# Patient Record
Sex: Male | Born: 1935 | Race: White | Hispanic: No | Marital: Married
Health system: Southern US, Community
[De-identification: ages and names within clinical notes are randomized; demographics above are authoritative.]

## PROBLEM LIST (undated history)

## (undated) ENCOUNTER — Emergency Department: Admission: EM | Payer: No Typology Code available for payment source

## (undated) DIAGNOSIS — K219 Gastro-esophageal reflux disease without esophagitis: Secondary | ICD-10-CM

## (undated) DIAGNOSIS — E785 Hyperlipidemia, unspecified: Secondary | ICD-10-CM

## (undated) DIAGNOSIS — Z974 Presence of external hearing-aid: Secondary | ICD-10-CM

## (undated) HISTORY — PX: TONSILLECTOMY: SUR1361

---

## 2009-09-18 ENCOUNTER — Ambulatory Visit: Payer: Self-pay | Admitting: Internal Medicine

## 2009-09-23 ENCOUNTER — Ambulatory Visit: Payer: Self-pay | Admitting: Sports Medicine

## 2009-10-18 ENCOUNTER — Ambulatory Visit: Payer: Self-pay | Admitting: Anesthesiology

## 2009-10-27 ENCOUNTER — Ambulatory Visit: Payer: Self-pay | Admitting: Anesthesiology

## 2009-11-30 ENCOUNTER — Ambulatory Visit: Payer: Self-pay | Admitting: Anesthesiology

## 2009-12-29 ENCOUNTER — Ambulatory Visit: Payer: Self-pay | Admitting: Anesthesiology

## 2014-03-12 ENCOUNTER — Emergency Department: Payer: Self-pay | Admitting: Emergency Medicine

## 2014-03-12 LAB — CBC
HCT: 39.9 % — ABNORMAL LOW (ref 40.0–52.0)
HGB: 12.9 g/dL — ABNORMAL LOW (ref 13.0–18.0)
MCH: 31 pg (ref 26.0–34.0)
MCHC: 32.5 g/dL (ref 32.0–36.0)
MCV: 96 fL (ref 80–100)
Platelet: 272 10*3/uL (ref 150–440)
RBC: 4.17 10*6/uL — ABNORMAL LOW (ref 4.40–5.90)
RDW: 13.5 % (ref 11.5–14.5)
WBC: 8.4 10*3/uL (ref 3.8–10.6)

## 2014-03-13 LAB — COMPREHENSIVE METABOLIC PANEL
ALK PHOS: 82 U/L
Albumin: 3.4 g/dL (ref 3.4–5.0)
Anion Gap: 9 (ref 7–16)
BILIRUBIN TOTAL: 0.3 mg/dL (ref 0.2–1.0)
BUN: 16 mg/dL (ref 7–18)
CALCIUM: 8.4 mg/dL — AB (ref 8.5–10.1)
Chloride: 104 mmol/L (ref 98–107)
Co2: 27 mmol/L (ref 21–32)
Creatinine: 1.17 mg/dL (ref 0.60–1.30)
EGFR (Non-African Amer.): >60
GLUCOSE: 152 mg/dL — AB (ref 65–99)
Osmolality: 284 (ref 275–301)
Potassium: 3.4 mmol/L — ABNORMAL LOW (ref 3.5–5.1)
SGOT(AST): 79 U/L — ABNORMAL HIGH (ref 15–37)
SGPT (ALT): 64 U/L — ABNORMAL HIGH
Sodium: 140 mmol/L (ref 136–145)
Total Protein: 6.6 g/dL (ref 6.4–8.2)

## 2014-03-13 LAB — TROPONIN I: Troponin-I: <0.02 ng/mL

## 2014-03-13 LAB — LIPASE, BLOOD: LIPASE: 145 U/L (ref 73–393)

## 2014-03-25 ENCOUNTER — Ambulatory Visit: Payer: Self-pay | Admitting: Surgery

## 2016-07-29 IMAGING — NM NUCLEAR MEDICINE HEPATOHBILIARY INCLUDE GB
2 series · 12 of 12 positions shown · non-contrast
Comparison: Abdominal ultrasound March 13, 2014

CLINICAL DATA: Two weeks of upper abdominal pain with 9 lb weight
loss

EXAM:
NUCLEAR MEDICINE HEPATOBILIARY IMAGING WITH GALLBLADDER EF
TECHNIQUE: Sequential images of the abdomen were obtained [DATE] minutes
following intravenous administration of radiopharmaceutical. After
slow intravenous infusion of 1.61 micrograms Cholecystokinin,
gallbladder ejection fraction was determined.
RADIOPHARMACEUTICALS:  8.25 Millicurie 7c-SSm Choletec

[Series 1000: gallbladder dynamic · 4.80mm/px · 6 of 120 frames shown]
[frame 11/120]
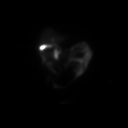
[frame 31/120]
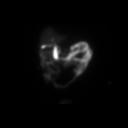
[frame 51/120]
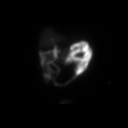
[frame 71/120]
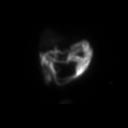
[frame 91/120]
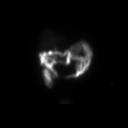
[frame 111/120]
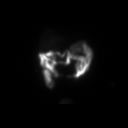

[Series 1000: gallbladder dynamic (results) · 4.80mm/px · 6 of 120 frames shown]
[frame 11/120]
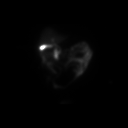
[frame 31/120]
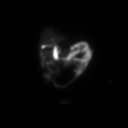
[frame 51/120]
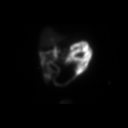
[frame 71/120]
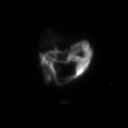
[frame 91/120]
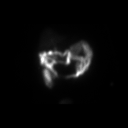
[frame 111/120]
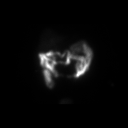

[12 of 12 positions shown; findings below may reference images not displayed]

FINDINGS: There is adequate uptake of the radiopharmaceutical by the liver.
Activity is visible within the intrahepatic ducts and common bile
duct by 15 min. Bowel activity is evident by 20 min. The gallbladder
is visualized by 30 min.

The 30 min gallbladder ejection fraction is 96%.. At 30 min, normal
ejection fraction is expected to be greater than 30%.

The patient did not experience symptoms during CCK infusion.
IMPRESSION: Normal hepatobiliary scan and normal gallbladder ejection fraction.

## 2018-05-15 NOTE — Discharge Instructions (Signed)

## 2018-05-20 ENCOUNTER — Encounter: Payer: Self-pay | Admitting: *Deleted

## 2018-05-20 ENCOUNTER — Other Ambulatory Visit: Payer: Self-pay

## 2018-05-22 ENCOUNTER — Ambulatory Visit
Admission: RE | Admit: 2018-05-22 | Discharge: 2018-05-22 | Disposition: A | Discharge status: Home / Self Care | Payer: Medicare Other | Attending: Ophthalmology | Admitting: Ophthalmology

## 2018-05-22 ENCOUNTER — Encounter
Admission: RE | Disposition: A | Discharge status: Home / Self Care | Payer: Self-pay | Source: Home / Self Care | Attending: Ophthalmology

## 2018-05-22 ENCOUNTER — Ambulatory Visit: Payer: Medicare Other | Admitting: Anesthesiology

## 2018-05-22 DIAGNOSIS — Z79899 Other long term (current) drug therapy: Secondary | ICD-10-CM | POA: Diagnosis not present

## 2018-05-22 DIAGNOSIS — K219 Gastro-esophageal reflux disease without esophagitis: Secondary | ICD-10-CM | POA: Insufficient documentation

## 2018-05-22 DIAGNOSIS — N400 Enlarged prostate without lower urinary tract symptoms: Secondary | ICD-10-CM | POA: Insufficient documentation

## 2018-05-22 DIAGNOSIS — E7800 Pure hypercholesterolemia, unspecified: Secondary | ICD-10-CM | POA: Diagnosis not present

## 2018-05-22 DIAGNOSIS — H2512 Age-related nuclear cataract, left eye: Secondary | ICD-10-CM | POA: Insufficient documentation

## 2018-05-22 DIAGNOSIS — Z7982 Long term (current) use of aspirin: Secondary | ICD-10-CM | POA: Insufficient documentation

## 2018-05-22 DIAGNOSIS — H5703 Miosis: Secondary | ICD-10-CM | POA: Diagnosis not present

## 2018-05-22 HISTORY — DX: Hyperlipidemia, unspecified: E78.5

## 2018-05-22 HISTORY — PX: CATARACT EXTRACTION W/PHACO: SHX586

## 2018-05-22 HISTORY — DX: Gastro-esophageal reflux disease without esophagitis: K21.9

## 2018-05-22 HISTORY — DX: Presence of external hearing-aid: Z97.4

## 2018-05-22 SURGERY — PHACOEMULSIFICATION, CATARACT, WITH IOL INSERTION
Anesthesia: Monitor Anesthesia Care | Site: Eye | Laterality: Left

## 2018-05-22 MED ORDER — LIDOCAINE HCL (PF) 2 % IJ SOLN
INTRAOCULAR | Status: DC | PRN
  Administered 2018-05-22: 1 mL

## 2018-05-22 MED ORDER — EPINEPHRINE PF 1 MG/ML IJ SOLN
INTRAOCULAR | Status: DC | PRN
  Administered 2018-05-22: 99 mL via OPHTHALMIC

## 2018-05-22 MED ORDER — OXYCODONE HCL 5 MG/5ML PO SOLN: 5.0000 mg | Freq: Once | ORAL | Status: DC | PRN

## 2018-05-22 MED ORDER — TETRACAINE HCL 0.5 % OP SOLN
1.0000 [drp] | OPHTHALMIC | Status: DC | PRN
  Administered 2018-05-22 (×3): 1 [drp] via OPHTHALMIC

## 2018-05-22 MED ORDER — OXYCODONE HCL 5 MG PO TABS: 5.0000 mg | ORAL_TABLET | Freq: Once | ORAL | Status: DC | PRN

## 2018-05-22 MED ORDER — CEFUROXIME OPHTHALMIC INJECTION 1 MG/0.1 ML
INJECTION | OPHTHALMIC | Status: DC | PRN
  Administered 2018-05-22: 0.1 mL via INTRACAMERAL

## 2018-05-22 MED ORDER — MOXIFLOXACIN HCL 0.5 % OP SOLN
1.0000 [drp] | OPHTHALMIC | Status: DC | PRN
  Administered 2018-05-22 (×3): 1 [drp] via OPHTHALMIC

## 2018-05-22 MED ORDER — ARMC OPHTHALMIC DILATING DROPS
1.0000 "application " | OPHTHALMIC | Status: DC | PRN
  Administered 2018-05-22 (×3): 1 via OPHTHALMIC

## 2018-05-22 MED ORDER — MIDAZOLAM HCL 2 MG/2ML IJ SOLN
INTRAMUSCULAR | Status: DC | PRN
  Administered 2018-05-22: 1 mg via INTRAVENOUS

## 2018-05-22 MED ORDER — NA HYALUR & NA CHOND-NA HYALUR 0.4-0.35 ML IO KIT
PACK | INTRAOCULAR | Status: DC | PRN
  Administered 2018-05-22: 1 mL via INTRAOCULAR

## 2018-05-22 MED ORDER — BRIMONIDINE TARTRATE-TIMOLOL 0.2-0.5 % OP SOLN
OPHTHALMIC | Status: DC | PRN
  Administered 2018-05-22: 1 [drp] via OPHTHALMIC

## 2018-05-22 SURGICAL SUPPLY — 19 items
CANNULA ANT/CHMB 27GA (MISCELLANEOUS) ×3 IMPLANT
GLOVE SURG LX 7.5 STRW (GLOVE) ×2
GLOVE SURG LX STRL 7.5 STRW (GLOVE) ×1 IMPLANT
GLOVE SURG TRIUMPH 8.0 PF LTX (GLOVE) ×3 IMPLANT
GOWN STRL REUS W/ TWL LRG LVL3 (GOWN DISPOSABLE) ×2 IMPLANT
GOWN STRL REUS W/TWL LRG LVL3 (GOWN DISPOSABLE) ×4
LENS IOL ACRYSOF IQ TORIC 21.5 ×3 IMPLANT
MARKER SKIN DUAL TIP RULER LAB (MISCELLANEOUS) ×3 IMPLANT
NEEDLE FILTER BLUNT 18X 1/2SAF (NEEDLE) ×2
NEEDLE FILTER BLUNT 18X1 1/2 (NEEDLE) ×1 IMPLANT
PACK CATARACT BRASINGTON (MISCELLANEOUS) ×3 IMPLANT
PACK EYE AFTER SURG (MISCELLANEOUS) ×3 IMPLANT
PACK OPTHALMIC (MISCELLANEOUS) ×3 IMPLANT
RING MALYGIN 7.0 (MISCELLANEOUS) ×3 IMPLANT
SYR 3ML LL SCALE MARK (SYRINGE) ×3 IMPLANT
SYR 5ML LL (SYRINGE) ×3 IMPLANT
SYR TB 1ML LUER SLIP (SYRINGE) ×3 IMPLANT
WATER STERILE IRR 500ML POUR (IV SOLUTION) ×3 IMPLANT
WIPE NON LINTING 3.25X3.25 (MISCELLANEOUS) ×3 IMPLANT

## 2018-05-22 NOTE — Anesthesia Preprocedure Evaluation (Signed)
Anesthesia Evaluation  Patient identified by MRN, date of birth, ID band  Reviewed: NPO status   History of Anesthesia Complications Negative for: history of anesthetic complications  Airway Mallampati: II  TM Distance: >3 FB Neck ROM: full    Dental no notable dental hx.    Pulmonary neg pulmonary ROS,    Pulmonary exam normal        Cardiovascular Exercise Tolerance: Good negative cardio ROS Normal cardiovascular exam  Lipids    Neuro/Psych HoHearing negative psych ROS   GI/Hepatic Neg liver ROS, GERD  Controlled,  Endo/Other  negative endocrine ROS  Renal/GU negative Renal ROS   bph    Musculoskeletal   Abdominal   Peds  Hematology negative hematology ROS (+)   Anesthesia Other Findings   Reproductive/Obstetrics                             Anesthesia Physical Anesthesia Plan  ASA: II  Anesthesia Plan: MAC   Post-op Pain Management:    Induction:   PONV Risk Score and Plan:   Airway Management Planned:   Additional Equipment:   Intra-op Plan:   Post-operative Plan:   Informed Consent: I have reviewed the patients History and Physical, chart, labs and discussed the procedure including the risks, benefits and alternatives for the proposed anesthesia with the patient or authorized representative who has indicated his/her understanding and acceptance.       Plan Discussed with: CRNA  Anesthesia Plan Comments:         Anesthesia Quick Evaluation

## 2018-05-22 NOTE — Op Note (Signed)
OPERATIVE NOTE  Willie Mcdonald [id number] 05/22/2018  PREOPERATIVE DIAGNOSIS:   Nuclear sclerotic cataract left eye with miotic pupil      H25.12   POSTOPERATIVE DIAGNOSIS:   Nuclear sclerotic cataract left eye with miotic pupil.     PROCEDURE:  Phacoemulsification with Toric posterior chamber intraocular lens implantation of the left eye which required pupil stretching with the Malyugin pupil expansion device   LENS:   Implant Name Type Inv. Item Serial No. Manufacturer Lot No. LRB No. Used  LENS IOL TORIC 21.5 - [id number]  LENS IOL TORIC 21.5 [id number] ALCON  Left 1     [id number] 21.5 D Toric IOL with 2.25 D cylindrical [power placed at axis 11 degrees   ULTRASOUND TIME: 18 % of 1 minutes, 30 seconds.  CDE 16.1   SURGEON:  Deirdre Evenerhadwick R. Fallen Crisostomo, MD   ANESTHESIA: Topical with tetracaine drops and 2% Xylocaine jelly, augmented with 1% preservative-free intracameral lidocaine.   COMPLICATIONS:  None.   DESCRIPTION OF PROCEDURE:  The patient was identified in the holding room and transported to the operating room and placed in the supine position under the operating microscope.  The left eye was identified as the operative eye and it was prepped and draped in the usual sterile ophthalmic fashion.   A 1 millimeter clear-corneal paracentesis was made at the 1:30 position.  The anterior chamber was filled with Viscoat viscoelastic.  0.5 ml of preservative-free 1% lidocaine was injected into the anterior chamber.  A 2.4 millimeter keratome was used to make a near-clear corneal incision at the 10:30 position.  A Malyugin pupil expander was then placed through the main incision and into the anterior chamber of the eye.  The edge of the iris was secured on the lip of the pupil expander and it was released, thereby expanding the pupil to approximately 7.5 millimeters for completion of the cataract surgery.  Additional Viscoat was placed in the anterior chamber.  A cystotome and capsulorrhexis  forceps were used to make a curvilinear capsulorrhexis.   Balanced salt solution was used to hydrodissect and hydrodelineate the lens nucleus.   Phacoemulsification was used in stop and chop fashion to remove the lens, nucleus and epinucleus.  The remaining cortex was aspirated using the irrigation aspiration handpiece.  Additional Provisc was placed into the eye to distend the capsular bag for lens placement.  A 21.5 diopter lens was then injected into the capsular bag.  It was rotated clockwise until the axis marks on the lens were approximately 15 degrees in the counterclockwise direction to the intended alignment.  The viscoelastic was aspirated from the eye using the irrigation aspiration handpiece.  Then, a Koch spatula through the sideport incision was used to rotate the lens in a clockwise direction until the axis markings of the intraocular lens were lined up with the Verion alignment.  The pupil expanding ring was removed using a Kuglen hook and insertion device. The remaining viscoelastic was aspirated from the capsular bag and the anterior chamber.  The anterior chamber was filled with balanced salt solution to inflate to a physiologic pressure.   Wounds were hydrated with balanced salt solution.  The anterior chamber was inflated to a physiologic pressure with balanced salt solution.  No wound leaks were noted. Cefuroxime 0.1 ml of a 10mg /ml solution was injected into the anterior chamber for a dose of 1 mg of intracameral antibiotic at the completion of the case.   Timolol and Brimonidine drops were applied to the eye.  The patient was taken to the recovery room in stable condition without complications of anesthesia or surgery.  Willie Mcdonald 05/22/2018, 10:21 AM

## 2018-05-22 NOTE — H&P (Signed)

## 2018-05-22 NOTE — Anesthesia Postprocedure Evaluation (Signed)
Anesthesia Post Note  Patient: DYRELL TUCCILLO  Procedure(s) Performed: CATARACT EXTRACTION PHACO AND INTRAOCULAR LENS PLACEMENT (IOC)  COMPLICATED LEFT TORIC LENS (Left Eye)  Patient location during evaluation: PACU Anesthesia Type: MAC Level of consciousness: awake and alert Pain management: pain level controlled Vital Signs Assessment: post-procedure vital signs reviewed and stable Respiratory status: spontaneous breathing, nonlabored ventilation, respiratory function stable and patient connected to nasal cannula oxygen Cardiovascular status: stable and blood pressure returned to baseline Postop Assessment: no apparent nausea or vomiting Anesthetic complications: no    Ailyn Gladd

## 2018-05-22 NOTE — Transfer of Care (Signed)
Immediate Anesthesia Transfer of Care Note  Patient: Willie Mcdonald  Procedure(s) Performed: CATARACT EXTRACTION PHACO AND INTRAOCULAR LENS PLACEMENT (IOC)  COMPLICATED LEFT TORIC LENS (Left Eye)  Patient Location: PACU  Anesthesia Type: MAC  Level of Consciousness: awake, alert  and patient cooperative  Airway and Oxygen Therapy: Patient Spontanous Breathing and Patient connected to supplemental oxygen  Post-op Assessment: Post-op Vital signs reviewed, Patient's Cardiovascular Status Stable, Respiratory Function Stable, Patent Airway and No signs of Nausea or vomiting  Post-op Vital Signs: Reviewed and stable  Complications: No apparent anesthesia complications

## 2018-05-23 ENCOUNTER — Encounter: Payer: Self-pay | Admitting: Ophthalmology

## 2018-06-03 ENCOUNTER — Encounter: Payer: Self-pay | Admitting: *Deleted

## 2018-06-03 ENCOUNTER — Other Ambulatory Visit: Payer: Self-pay

## 2018-06-05 NOTE — Discharge Instructions (Signed)

## 2018-06-12 ENCOUNTER — Ambulatory Visit
Admission: RE | Admit: 2018-06-12 | Discharge: 2018-06-12 | Disposition: A | Discharge status: Home / Self Care | Payer: Medicare Other | Attending: Ophthalmology | Admitting: Ophthalmology

## 2018-06-12 ENCOUNTER — Ambulatory Visit: Payer: Medicare Other | Admitting: Anesthesiology

## 2018-06-12 ENCOUNTER — Encounter
Admission: RE | Disposition: A | Discharge status: Home / Self Care | Payer: Self-pay | Source: Home / Self Care | Attending: Ophthalmology

## 2018-06-12 DIAGNOSIS — H5703 Miosis: Secondary | ICD-10-CM | POA: Insufficient documentation

## 2018-06-12 DIAGNOSIS — H2511 Age-related nuclear cataract, right eye: Secondary | ICD-10-CM | POA: Insufficient documentation

## 2018-06-12 DIAGNOSIS — Z7982 Long term (current) use of aspirin: Secondary | ICD-10-CM | POA: Diagnosis not present

## 2018-06-12 DIAGNOSIS — Z79899 Other long term (current) drug therapy: Secondary | ICD-10-CM | POA: Insufficient documentation

## 2018-06-12 DIAGNOSIS — E7800 Pure hypercholesterolemia, unspecified: Secondary | ICD-10-CM | POA: Insufficient documentation

## 2018-06-12 DIAGNOSIS — K219 Gastro-esophageal reflux disease without esophagitis: Secondary | ICD-10-CM | POA: Insufficient documentation

## 2018-06-12 DIAGNOSIS — Z9842 Cataract extraction status, left eye: Secondary | ICD-10-CM | POA: Insufficient documentation

## 2018-06-12 HISTORY — PX: CATARACT EXTRACTION W/PHACO: SHX586

## 2018-06-12 SURGERY — PHACOEMULSIFICATION, CATARACT, WITH IOL INSERTION
Anesthesia: Monitor Anesthesia Care | Site: Eye | Laterality: Right

## 2018-06-12 MED ORDER — TETRACAINE HCL 0.5 % OP SOLN
1.0000 [drp] | OPHTHALMIC | Status: DC | PRN
  Administered 2018-06-12 (×3): 1 [drp] via OPHTHALMIC

## 2018-06-12 MED ORDER — LIDOCAINE HCL (PF) 2 % IJ SOLN
INTRAOCULAR | Status: DC | PRN
  Administered 2018-06-12: 2 mL

## 2018-06-12 MED ORDER — FENTANYL CITRATE (PF) 100 MCG/2ML IJ SOLN
INTRAMUSCULAR | Status: DC | PRN
  Administered 2018-06-12: 50 ug via INTRAVENOUS

## 2018-06-12 MED ORDER — MIDAZOLAM HCL 2 MG/2ML IJ SOLN
INTRAMUSCULAR | Status: DC | PRN
  Administered 2018-06-12: 1 mg via INTRAVENOUS

## 2018-06-12 MED ORDER — LACTATED RINGERS IV SOLN: INTRAVENOUS | Status: DC

## 2018-06-12 MED ORDER — NA HYALUR & NA CHOND-NA HYALUR 0.4-0.35 ML IO KIT
PACK | INTRAOCULAR | Status: DC | PRN
  Administered 2018-06-12: 1 mL via INTRAOCULAR

## 2018-06-12 MED ORDER — CEFUROXIME OPHTHALMIC INJECTION 1 MG/0.1 ML
INJECTION | OPHTHALMIC | Status: DC | PRN
  Administered 2018-06-12: 0.1 mL via INTRACAMERAL

## 2018-06-12 MED ORDER — BRIMONIDINE TARTRATE-TIMOLOL 0.2-0.5 % OP SOLN
OPHTHALMIC | Status: DC | PRN
  Administered 2018-06-12: 1 [drp] via OPHTHALMIC

## 2018-06-12 MED ORDER — EPINEPHRINE PF 1 MG/ML IJ SOLN
INTRAOCULAR | Status: DC | PRN
  Administered 2018-06-12: 93 mL via OPHTHALMIC

## 2018-06-12 MED ORDER — MOXIFLOXACIN HCL 0.5 % OP SOLN
1.0000 [drp] | OPHTHALMIC | Status: DC | PRN
  Administered 2018-06-12 (×3): 1 [drp] via OPHTHALMIC

## 2018-06-12 MED ORDER — ARMC OPHTHALMIC DILATING DROPS
1.0000 "application " | OPHTHALMIC | Status: DC | PRN
  Administered 2018-06-12 (×3): 1 via OPHTHALMIC

## 2018-06-12 SURGICAL SUPPLY — 22 items
CANNULA ANT/CHMB 27G (MISCELLANEOUS) ×1 IMPLANT
CANNULA ANT/CHMB 27GA (MISCELLANEOUS) ×3 IMPLANT
GLOVE SURG LX 7.5 STRW (GLOVE) ×2
GLOVE SURG LX STRL 7.5 STRW (GLOVE) ×1 IMPLANT
GLOVE SURG TRIUMPH 8.0 PF LTX (GLOVE) ×3 IMPLANT
GOWN STRL REUS W/ TWL LRG LVL3 (GOWN DISPOSABLE) ×2 IMPLANT
GOWN STRL REUS W/TWL LRG LVL3 (GOWN DISPOSABLE) ×4
LENS IOL ACRYSOF IQ TORIC 22.0 ×2 IMPLANT
LENS IOL IQ TORIC 3 22.0 IMPLANT
MARKER SKIN DUAL TIP RULER LAB (MISCELLANEOUS) ×3 IMPLANT
NDL FILTER BLUNT 18X1 1/2 (NEEDLE) ×1 IMPLANT
NEEDLE FILTER BLUNT 18X 1/2SAF (NEEDLE) ×2
NEEDLE FILTER BLUNT 18X1 1/2 (NEEDLE) ×1 IMPLANT
PACK CATARACT BRASINGTON (MISCELLANEOUS) ×3 IMPLANT
PACK EYE AFTER SURG (MISCELLANEOUS) ×3 IMPLANT
PACK OPTHALMIC (MISCELLANEOUS) ×3 IMPLANT
RING MALYGIN 7.0 (MISCELLANEOUS) ×2 IMPLANT
SYR 3ML LL SCALE MARK (SYRINGE) ×3 IMPLANT
SYR 5ML LL (SYRINGE) ×3 IMPLANT
SYR TB 1ML LUER SLIP (SYRINGE) ×3 IMPLANT
WATER STERILE IRR 500ML POUR (IV SOLUTION) ×3 IMPLANT
WIPE NON LINTING 3.25X3.25 (MISCELLANEOUS) ×3 IMPLANT

## 2018-06-12 NOTE — Anesthesia Postprocedure Evaluation (Signed)
Anesthesia Post Note  Patient: Willie Mcdonald  Procedure(s) Performed: CATARACT EXTRACTION PHACO AND INTRAOCULAR LENS PLACEMENT (IOC)  RIGHT TORIC LENS (Right Eye)  Patient location during evaluation: PACU Anesthesia Type: MAC Level of consciousness: awake and alert Pain management: pain level controlled Vital Signs Assessment: post-procedure vital signs reviewed and stable Respiratory status: spontaneous breathing Cardiovascular status: stable Anesthetic complications: no    Jaci Standard, III,  Nickayla Mcinnis D

## 2018-06-12 NOTE — Transfer of Care (Signed)
Immediate Anesthesia Transfer of Care Note  Patient: Willie Mcdonald  Procedure(s) Performed: CATARACT EXTRACTION PHACO AND INTRAOCULAR LENS PLACEMENT (IOC)  RIGHT TORIC LENS (Right Eye)  Patient Location: PACU  Anesthesia Type: MAC  Level of Consciousness: awake, alert  and patient cooperative  Airway and Oxygen Therapy: Patient Spontanous Breathing and Patient connected to supplemental oxygen  Post-op Assessment: Post-op Vital signs reviewed, Patient's Cardiovascular Status Stable, Respiratory Function Stable, Patent Airway and No signs of Nausea or vomiting  Post-op Vital Signs: Reviewed and stable  Complications: No apparent anesthesia complications

## 2018-06-12 NOTE — Op Note (Signed)
OPERATIVE NOTE  Willie Mcdonald [id number] 06/12/2018   PREOPERATIVE DIAGNOSIS:    Nuclear Sclerotic Cataract Right eye with miotic pupil.        H25.11  POSTOPERATIVE DIAGNOSIS: Nuclear Sclerotic Cataract Right eye with miotic pupil.          PROCEDURE:  Phacoemusification with Toric posterior chamber intraocular lens placement of the right eye which required pupil stretching with the Malyugin pupil expansion device.  LENS:   Implant Name Type Inv. Item Serial No. Manufacturer Lot No. LRB No. Used  LENS IOL TORIC 22.0 - [id number]  LENS IOL TORIC 22.0 [id number] ALCON  Right 1    [id number] 22.0 Toric IOL with 1.5 D cylindrical power [placed at axis of 28 degrees   ULTRASOUND TIME: 19 % of 1 minutes 25 seconds, CDE 15.8  SURGEON:  Deirdre Evener, MD   ANESTHESIA:  Topical with tetracaine drops and 2% Xylocaine jelly, augmented with 1% preservative-free intracameral lidocaine.   COMPLICATIONS:  None.   DESCRIPTION OF PROCEDURE:  The patient was identified in the holding room and transported to the operating room and placed in the supine position under the operating microscope. Theright eye was identified as the operative eye and it was prepped and draped in the usual sterile ophthalmic fashion.   A 1 millimeter clear-corneal paracentesis was made at the 12:00 position.  0.5 ml of preservative-free 1% lidocaine was injected into the anterior chamber. The anterior chamber was filled with Viscoat viscoelastic.  A 2.4 millimeter keratome was used to make a near-clear corneal incision at the 9:00 position. A Malyugin pupil expander was then placed through the main incision and into the anterior chamber of the eye.  The edge of the iris was secured on the lip of the pupil expander and it was released, thereby expanding the pupil to approximately 7 millimeters for completion of the cataract surgery.  Additional Viscoat was placed in the anterior chamber.  A cystotome and capsulorrhexis  forceps were used to make a curvilinear capsulorrhexis.   Balanced salt solution was used to hydrodissect and hydrodelineate the lens nucleus.   Phacoemulsification was used in stop and chop fashion to remove the lens, nucleus and epinucleus.  The remaining cortex was aspirated using the irrigation aspiration handpiece.  Additional Provisc was placed into the eye to distend the capsular bag for lens placement.  A lens was then injected into the capsular bag.  It was rotated into a position of 28 degrees, previously calculated with the Verion alignment. The pupil expanding ring was removed using a Kuglen hook and insertion device. The remaining viscoelastic was aspirated from the capsular bag and the anterior chamber.  The anterior chamber was filled with balanced salt solution to inflate to a physiologic pressure. The alignment was verified with the Verion alignment system. Wounds were hydrated with balanced salt solution.  The anterior chamber was inflated to a physiologic pressure with balanced salt solution.  No wound leaks were noted.Cefuroxime 0.1 ml of a 10mg /ml solution was injected into the anterior chamber for a dose of 1 mg of intracameral antibiotic at the completion of the case. Timolol and Brimonidine drops were applied to the eye.  The patient was taken to the recovery room in stable condition without complications of anesthesia or surgery.  Willie Mcdonald 06/12/2018, 9:17 AM

## 2018-06-12 NOTE — H&P (Signed)

## 2018-06-12 NOTE — Anesthesia Preprocedure Evaluation (Signed)
Anesthesia Evaluation  Patient identified by MRN, date of birth, ID band Patient awake    Reviewed: Allergy & Precautions, H&P , NPO status , Patient's Chart, lab work & pertinent test results  Airway Mallampati: II  TM Distance: >3 FB Neck ROM: full    Dental no notable dental hx.    Pulmonary neg pulmonary ROS,    Pulmonary exam normal breath sounds clear to auscultation       Cardiovascular negative cardio ROS Normal cardiovascular exam Rhythm:regular Rate:Normal     Neuro/Psych    GI/Hepatic Neg liver ROS, Medicated,  Endo/Other  negative endocrine ROS  Renal/GU negative Renal ROS     Musculoskeletal   Abdominal   Peds  Hematology negative hematology ROS (+)   Anesthesia Other Findings   Reproductive/Obstetrics                             Anesthesia Physical Anesthesia Plan  ASA: II  Anesthesia Plan: MAC   Post-op Pain Management:    Induction:   PONV Risk Score and Plan:   Airway Management Planned:   Additional Equipment:   Intra-op Plan:   Post-operative Plan:   Informed Consent: I have reviewed the patients History and Physical, chart, labs and discussed the procedure including the risks, benefits and alternatives for the proposed anesthesia with the patient or authorized representative who has indicated his/her understanding and acceptance.       Plan Discussed with:   Anesthesia Plan Comments:         Anesthesia Quick Evaluation

## 2018-06-12 NOTE — Anesthesia Procedure Notes (Signed)
Procedure Name: MAC Performed by: Izetta Dakin, CRNA Pre-anesthesia Checklist: Timeout performed, Patient being monitored, Suction available, Emergency Drugs available and Patient identified Patient Re-evaluated:Patient Re-evaluated prior to induction Oxygen Delivery Method: Nasal cannula

## 2018-06-13 ENCOUNTER — Encounter: Payer: Self-pay | Admitting: Ophthalmology

## 2021-10-24 ENCOUNTER — Other Ambulatory Visit: Payer: Self-pay | Admitting: Internal Medicine

## 2021-10-24 DIAGNOSIS — R413 Other amnesia: Secondary | ICD-10-CM

## 2021-10-27 ENCOUNTER — Other Ambulatory Visit: Payer: Self-pay | Admitting: Internal Medicine

## 2021-10-27 DIAGNOSIS — Z0189 Encounter for other specified special examinations: Secondary | ICD-10-CM

## 2021-11-01 ENCOUNTER — Ambulatory Visit
Admission: RE | Admit: 2021-11-01 | Discharge: 2021-11-01 | Disposition: A | Discharge status: Home / Self Care | Payer: Medicare Other | Source: Ambulatory Visit | Attending: Internal Medicine | Admitting: Internal Medicine

## 2021-11-01 ENCOUNTER — Other Ambulatory Visit: Payer: Self-pay | Admitting: Internal Medicine

## 2021-11-01 DIAGNOSIS — R413 Other amnesia: Secondary | ICD-10-CM | POA: Diagnosis present

## 2021-11-01 DIAGNOSIS — Z0189 Encounter for other specified special examinations: Secondary | ICD-10-CM

## 2022-01-31 ENCOUNTER — Ambulatory Visit: Payer: Medicare Other | Attending: Internal Medicine

## 2022-01-31 DIAGNOSIS — R262 Difficulty in walking, not elsewhere classified: Secondary | ICD-10-CM | POA: Diagnosis present

## 2022-01-31 DIAGNOSIS — M6281 Muscle weakness (generalized): Secondary | ICD-10-CM | POA: Insufficient documentation

## 2022-01-31 DIAGNOSIS — R2681 Unsteadiness on feet: Secondary | ICD-10-CM | POA: Diagnosis present

## 2022-01-31 NOTE — Therapy (Signed)
OUTPATIENT PHYSICAL THERAPY NEURO EVALUATION   Patient Name: Willie Mcdonald MRN: [id number] DOB:[birth year], 87 y.o., male Today's Date: 01/31/2022   PCP: Marguarite Arbour, MD REFERRING PROVIDER: Marguarite Arbour, MD   PT End of Session - 01/31/22 1043     Visit Number 1    Number of Visits 24    Date for PT Re-Evaluation 04/25/22    Authorization Time Period 01/31/22 - 10/07/50 cert date    Progress Note Due on Visit 10    PT Start Time 1100    PT Stop Time 1145    PT Time Calculation (min) 45 min             Past Medical History:  Diagnosis Date   GERD (gastroesophageal reflux disease)    Hyperlipidemia    Wears hearing aid in both ears    Past Surgical History:  Procedure Laterality Date   CATARACT EXTRACTION W/PHACO Left 05/22/2018   Procedure: CATARACT EXTRACTION PHACO AND INTRAOCULAR LENS PLACEMENT (IOC)  COMPLICATED LEFT TORIC LENS;  Surgeon: Lockie Mola, MD;  Location: [facility];  Service: Ophthalmology;  Laterality: Left;  MALYUGIN   CATARACT EXTRACTION W/PHACO Right 06/12/2018   Procedure: CATARACT EXTRACTION PHACO AND INTRAOCULAR LENS PLACEMENT (IOC)  RIGHT TORIC LENS;  Surgeon: Lockie Mola, MD;  Location: [facility];  Service: Ophthalmology;  Laterality: Right;   TONSILLECTOMY     There are no problems to display for this patient.   ONSET DATE: 2 years  REFERRING DIAG:   R41.3 (ICD-10-CM) - Other amnesia  R26.89 (ICD-10-CM) - Other abnormalities of gait and mobility  R53.1 (ICD-10-CM) - Weakness    THERAPY DIAG:  Difficulty in walking, not elsewhere classified  Muscle weakness (generalized)  Unsteadiness on feet  Rationale for Evaluation and Treatment Rehabilitation  SUBJECTIVE:                                                                                                                                                                                             SUBJECTIVE STATEMENT: Pt is having  difficulty with his memory.  Pt's wife reports that the pt is having difficulty with dragging his feet and has been going on for a couple of years.  Pt's MD noted the pt to be having a shuffle gait pattern, which may come from [name] dementia.  It was recommended that the pt purchase a pedaling machine to use while sitting down and they purchased one.  They've only had it for approximately a week, so not able to do a whole lot yet or see benefits.  Pt's spouse notes that he is  having too much pain to walk at times in his low back and into the LE's.    Pt accompanied by:  Harlin Rain  PERTINENT HISTORY: Pt's wife reporting that pt has rounded shoulders/stooped posture that has progressively gotten worse over the past few years.  Pt has been diagnosed with [name] dementia as well which is affecting memory and movement patterns according to the pt and his spouse.  PAIN:  Are you having pain? No; only while sitting still. Worst Pain: 10/10 occasionally, when walking or doing something like pushing the grocery cart Best Pain: 0/10   PRECAUTIONS: None  WEIGHT BEARING RESTRICTIONS: No  FALLS: Has patient fallen in last 6 months? No; 4-5 near misses  LIVING ENVIRONMENT: Lives with: lives with their spouse Lives in: House/apartment Stairs: Yes: Internal: 15 steps; on right going up and External: 2 steps; on right going up First step does not have a rail. Has following equipment at home: Single point cane  PLOF: Independent  PATIENT GOALS: Prevent falls, and strengthen legs while decreasing your pain.  OBJECTIVE:   DIAGNOSTIC FINDINGS:   EXAM: MRI HEAD WITHOUT CONTRAST   IMPRESSION: No acute intracranial process. Overall cerebral atrophy is likely within normal limits for age, with possible slightly more advanced atrophy in the left-greater-than-right temporal lobe.  Pt has an X-ray from a different clinic that is not in current Epic Chart.   COGNITION: Overall cognitive  status: Within functional limits for tasks assessed   SENSATION: WFL  COORDINATION: WFL  POSTURE: rounded shoulders, forward head, and flexed trunk   LOWER EXTREMITY ROM:     WFL  LOWER EXTREMITY MMT:    MMT Right Eval Left Eval  Hip flexion 5 4+  Hip extension 5 4+  Hip abduction 5 4+  Hip adduction 5 4+  Knee flexion 5 4  Knee extension 5 4+  Ankle dorsiflexion 5 5  (Blank rows = not tested)   GAIT: Gait pattern: shuffling, decreased trunk rotation, trunk flexed, narrow BOS, poor foot clearance- Right, and poor foot clearance- Left Assistive device utilized: None Level of assistance: SBA Comments: Pt ambulates with significant shuffling at times and has decreased foot clearance which would be indicative of increased risk of falling.  FUNCTIONAL TESTS:  5 times sit to stand: 12.91 with UE support; 15.14 Timed up and go (TUG): 15.94 sec 6 minute walk test: Deferred to next session 10 meter walk test: Deferred to next session Functional gait assessment: 17/30  PATIENT SURVEYS:  FOTO 49; Goal: 54   TODAY'S TREATMENT:                                                                                   Evaluation only   PATIENT EDUCATION: Education details: Pt and spouse educated on role of PT and services provided during POC. Person educated: Patient and Spouse Education method: Explanation Education comprehension: verbalized understanding  HOME EXERCISE PROGRAM:  Give to pt at next visit.   GOALS: Goals reviewed with patient? Yes  SHORT TERM GOALS: Target date: 02/28/2022  1.  Pt will be independent with HEP in order to demonstrate increased ability to perform tasks related to occupation/hobbies. Baseline: HEP  to be given at next visit Goal status: INITIAL    LONG TERM GOALS: Target date: 04/25/2022 1.  Patient (> 20 years old) will complete five times sit to stand test in < 15 seconds indicating an increased LE strength and improved  balance. Baseline: 15.14 with no UE support Goal status: INITIAL  2.  Patient will increase FOTO score to equal to or greater than 54 to demonstrate statistically significant improvement in mobility and quality of life.  Baseline: 49 Goal status: INITIAL   3.  Patient will increase FGA score by > 4 points to demonstrate decreased fall risk during functional activities. Baseline: 17/30 Goal status: INITIAL   4.  Patient will reduce timed up and go to <11 seconds to reduce fall risk and demonstrate improved transfer/gait ability. Baseline: 15.94 sec Goal status: INITIAL  5.  Patient will increase 10 meter walk test to >1.46m/s as to improve gait speed for better community ambulation and to reduce fall risk. Baseline: Not given at IE Goal status: INITIAL  6.  Patient will increase six minute walk test distance to >1000 for progression to community ambulator and improve gait ability Baseline: Not given at IE Goal status: INITIAL  ASSESSMENT:  CLINICAL IMPRESSION: Patient is an 87 y.o. male who was seen today for physical therapy evaluation and treatment for weakness and gait abnormality.  Pt presents with physical impairments of decreased activity tolerance, decreased balance, increased pain in lumbar spine, and decreased strength in LE's as noted.  Pt will benefit from skilled therapy to address tolerance, balance, pain, and strength impairments necessary for improvement in quality of life.  Pt. demonstrates understanding of this plan of care and agrees with this plan.    OBJECTIVE IMPAIRMENTS: Abnormal gait, decreased activity tolerance, decreased balance, difficulty walking, decreased strength, decreased safety awareness, and pain.   ACTIVITY LIMITATIONS: carrying, lifting, bending, standing, and squatting  PARTICIPATION LIMITATIONS: yard work  PERSONAL FACTORS: Age, Fitness, Past/current experiences, and Time since onset of injury/illness/exacerbation are also affecting patient's  functional outcome.   REHAB POTENTIAL: Good  CLINICAL DECISION MAKING: Stable/uncomplicated  EVALUATION COMPLEXITY: Low  PLAN:  PT FREQUENCY: 2x/week  PT DURATION: 12 weeks  PLANNED INTERVENTIONS: Therapeutic exercises, Therapeutic activity, Neuromuscular re-education, Balance training, Gait training, Patient/Family education, Self Care, Joint mobilization, DME instructions, Dry Needling, and Manual therapy  PLAN FOR NEXT SESSION: Give HEP for lumbar mobility, extension based exercises, and balance related tasks.  Assess and .   Nolon Bussing, PT, DPT Physical Therapist- [facility]  01/31/22, 3:59 PM

## 2022-02-02 ENCOUNTER — Ambulatory Visit: Payer: Medicare Other

## 2022-02-02 DIAGNOSIS — R262 Difficulty in walking, not elsewhere classified: Secondary | ICD-10-CM

## 2022-02-02 DIAGNOSIS — M6281 Muscle weakness (generalized): Secondary | ICD-10-CM

## 2022-02-02 DIAGNOSIS — R2681 Unsteadiness on feet: Secondary | ICD-10-CM

## 2022-02-02 NOTE — Therapy (Signed)
OUTPATIENT PHYSICAL THERAPY NEURO TREATMENT   Patient Name: Willie Mcdonald MRN: [id number] DOB:08-22-1934, 87 y.o., male Today's Date: 02/03/2022   PCP: Idelle Crouch, MD REFERRING PROVIDER: Idelle Crouch, MD   PT End of Session - 02/02/22 1259     Visit Number 2    Number of Visits 24    Date for PT Re-Evaluation 04/25/22    Authorization Time Period 123456 - [id number] cert date    Progress Note Due on Visit 10    PT Start Time 1300    PT Stop Time 1345    PT Time Calculation (min) 45 min             Past Medical History:  Diagnosis Date   GERD (gastroesophageal reflux disease)    Hyperlipidemia    Wears hearing aid in both ears    Past Surgical History:  Procedure Laterality Date   CATARACT EXTRACTION W/PHACO Left 05/22/2018   Procedure: CATARACT EXTRACTION PHACO AND INTRAOCULAR LENS PLACEMENT ([place])  COMPLICATED LEFT TORIC LENS;  Surgeon: Leandrew Koyanagi, MD;  Location: [place];  Service: Ophthalmology;  Laterality: Left;  MALYUGIN   CATARACT EXTRACTION W/PHACO Right 06/12/2018   Procedure: CATARACT EXTRACTION PHACO AND INTRAOCULAR LENS PLACEMENT ([place])  RIGHT TORIC LENS;  Surgeon: Leandrew Koyanagi, MD;  Location: [place];  Service: Ophthalmology;  Laterality: Right;   TONSILLECTOMY     There are no problems to display for this patient.   ONSET DATE: 2 years  REFERRING DIAG:   R41.3 (ICD-10-CM) - Other amnesia  R26.89 (ICD-10-CM) - Other abnormalities of gait and mobility  R53.1 (ICD-10-CM) - Weakness    THERAPY DIAG:  Difficulty in walking, not elsewhere classified  Muscle weakness (generalized)  Unsteadiness on feet  Rationale for Evaluation and Treatment Rehabilitation  SUBJECTIVE:                                                                                                                                                                                             SUBJECTIVE STATEMENT:  Pt notes he can't  tell a difference today from how he felt the other day, just feeling lazy.  Pt denies doing anything other than sitting and watching TV.  Pt accompanied by:  Harlin Rain  PERTINENT HISTORY: Pt's wife reporting that pt has rounded shoulders/stooped posture that has progressively gotten worse over the past few years.  Pt has been diagnosed with [name] dementia as well which is affecting memory and movement patterns according to the pt and his spouse.  Pt denies needing to come to therapy  PAIN:  Are you having pain?  No; only while sitting still. Worst Pain: 10/10 occasionally, when walking or doing something like pushing the grocery cart Best Pain: 0/10   PRECAUTIONS: None  WEIGHT BEARING RESTRICTIONS: No  FALLS: Has patient fallen in last 6 months? No; 4-5 near misses  LIVING ENVIRONMENT: Lives with: lives with their spouse Lives in: House/apartment Stairs: Yes: Internal: 15 steps; on right going up and External: 2 steps; on right going up First step does not have a rail. Has following equipment at home: Single point cane  PLOF: Independent  PATIENT GOALS: Prevent falls, and strengthen legs while decreasing your pain.  OBJECTIVE:   DIAGNOSTIC FINDINGS:   EXAM: MRI HEAD WITHOUT CONTRAST   IMPRESSION: No acute intracranial process. Overall cerebral atrophy is likely within normal limits for age, with possible slightly more advanced atrophy in the left-greater-than-right temporal lobe.  Pt has an X-ray from a different clinic that is not in current Epic Chart.   COGNITION: Overall cognitive status: Within functional limits for tasks assessed   SENSATION: WFL  COORDINATION: WFL  POSTURE: rounded shoulders, forward head, and flexed trunk   LOWER EXTREMITY ROM:     WFL  LOWER EXTREMITY MMT:    MMT Right Eval Left Eval  Hip flexion 5 4+  Hip extension 5 4+  Hip abduction 5 4+  Hip adduction 5 4+  Knee flexion 5 4  Knee extension 5 4+  Ankle  dorsiflexion 5 5  (Blank rows = not tested)   GAIT: Gait pattern: shuffling, decreased trunk rotation, trunk flexed, narrow BOS, poor foot clearance- Right, and poor foot clearance- Left Assistive device utilized: None Level of assistance: SBA Comments: Pt ambulates with significant shuffling at times and has decreased foot clearance which would be indicative of increased risk of falling.  FUNCTIONAL TESTS:  5 times sit to stand: 12.91 with UE support; 15.14 Timed up and go (TUG): 15.94 sec 6 minute walk test: 1231 ft 10 meter walk test: 13.05 sec Functional gait assessment: 17/30  PATIENT SURVEYS:  FOTO 49; Goal: 54   TODAY'S TREATMENT:                                                                                   TherEx:   Seated hip abduction with BTB, 2x15 Seated hip adduction into rainbow physioball, 5 sec holds, 2x15 Seated marches with BTB resistance around the forefoot, 2x15 each LE with focus on lifting LE's straight and not getting sartorius compensation Standing wall angels with focus on correct posture to assist while performing ambulation   Continued with goal assessment as noted below 6 minute walk test: 1231 ft 10 meter walk test: 13.05 sec  PATIENT EDUCATION: Education details: Pt and spouse educated on role of PT and services provided during POC. Person educated: Patient and Spouse Education method: Explanation Education comprehension: verbalized understanding  HOME EXERCISE PROGRAM:     GOALS: Goals reviewed with patient? Yes  SHORT TERM GOALS: Target date: 03/03/2022  1.  Pt will be independent with HEP in order to demonstrate increased ability to perform tasks related to occupation/hobbies. Baseline: HEP to be given at next visit Goal status: INITIAL   LONG TERM GOALS: Target date:  04/28/2022  1.  Patient (> 38 years old) will complete five times sit to stand test in < 15 seconds indicating an increased LE strength and improved  balance. Baseline: 15.14 with no UE support Goal status: INITIAL  2.  Patient will increase FOTO score to equal to or greater than 54 to demonstrate statistically significant improvement in mobility and quality of life.  Baseline: 49 Goal status: INITIAL   3.  Patient will increase FGA score by > 4 points to demonstrate decreased fall risk during functional activities. Baseline: 17/30 Goal status: INITIAL   4.  Patient will reduce timed up and go to <11 seconds to reduce fall risk and demonstrate improved transfer/gait ability. Baseline: 15.94 sec Goal status: INITIAL  5.  Patient will increase 10 meter walk test to >1.11m/s as to improve gait speed for better community ambulation and to reduce fall risk. Baseline: Not given at IE Goal status: INITIAL  6.  Patient will increase six minute walk test distance to >1000 for progression to community ambulator and improve gait ability Baseline: 1232 ft at Eval Goal status: INITIAL  ASSESSMENT:  CLINICAL IMPRESSION:  Pt performed well with exercises given, however has some difficulty with LE shuffling following the 6 minute walk test.  Pt is able to ambulate a good distance, however when fatigued, tends to shuffle his feet at times and therapist has to provide CGA and minA at times to prevent uncontrolled descent to the floor.  Pt also with poor posture that will continue to be addressed in future sessions.   Pt will continue to benefit from skilled therapy to address remaining deficits in order to improve overall QoL and return to PLOF.          OBJECTIVE IMPAIRMENTS: Abnormal gait, decreased activity tolerance, decreased balance, difficulty walking, decreased strength, decreased safety awareness, and pain.   ACTIVITY LIMITATIONS: carrying, lifting, bending, standing, and squatting  PARTICIPATION LIMITATIONS: yard work  PERSONAL FACTORS: Age, Fitness, Past/current experiences, and Time since onset of injury/illness/exacerbation are  also affecting patient's functional outcome.   REHAB POTENTIAL: Good  CLINICAL DECISION MAKING: Stable/uncomplicated  EVALUATION COMPLEXITY: Low  PLAN:  PT FREQUENCY: 2x/week  PT DURATION: 12 weeks  PLANNED INTERVENTIONS: Therapeutic exercises, Therapeutic activity, Neuromuscular re-education, Balance training, Gait training, Patient/Family education, Self Care, Joint mobilization, DME instructions, Dry Needling, and Manual therapy  PLAN FOR NEXT SESSION: Continue with strengthening and postural control.     Gwenlyn Saran, PT, DPT Physical Therapist- [facility]  02/03/22, 10:50 AM

## 2022-02-07 ENCOUNTER — Ambulatory Visit: Payer: Medicare Other

## 2022-02-07 DIAGNOSIS — R2681 Unsteadiness on feet: Secondary | ICD-10-CM

## 2022-02-07 DIAGNOSIS — R262 Difficulty in walking, not elsewhere classified: Secondary | ICD-10-CM | POA: Diagnosis not present

## 2022-02-07 DIAGNOSIS — M6281 Muscle weakness (generalized): Secondary | ICD-10-CM

## 2022-02-07 NOTE — Therapy (Signed)
OUTPATIENT PHYSICAL THERAPY NEURO TREATMENT   Patient Name: Willie Mcdonald MRN: [id number] DOB:1935-01-09, 87 y.o., male Today's Date: 02/07/2022   PCP: Idelle Crouch, MD REFERRING PROVIDER: Idelle Crouch, MD   PT End of Session - 02/07/22 1134     Visit Number 3    Number of Visits 24    Date for PT Re-Evaluation 04/25/22    Authorization Time Period 95/09/32 - 6/71/24 cert date    Progress Note Due on Visit 10    PT Start Time 1132    PT Stop Time 1215    PT Time Calculation (min) 43 min              Past Medical History:  Diagnosis Date   GERD (gastroesophageal reflux disease)    Hyperlipidemia    Wears hearing aid in both ears    Past Surgical History:  Procedure Laterality Date   CATARACT EXTRACTION W/PHACO Left 05/22/2018   Procedure: CATARACT EXTRACTION PHACO AND INTRAOCULAR LENS PLACEMENT ([place])  COMPLICATED LEFT TORIC LENS;  Surgeon: Leandrew Koyanagi, MD;  Location: [place];  Service: Ophthalmology;  Laterality: Left;  MALYUGIN   CATARACT EXTRACTION W/PHACO Right 06/12/2018   Procedure: CATARACT EXTRACTION PHACO AND INTRAOCULAR LENS PLACEMENT ([place])  RIGHT TORIC LENS;  Surgeon: Leandrew Koyanagi, MD;  Location: [place];  Service: Ophthalmology;  Laterality: Right;   TONSILLECTOMY     There are no problems to display for this patient.   ONSET DATE: 2 years  REFERRING DIAG:   R41.3 (ICD-10-CM) - Other amnesia  R26.89 (ICD-10-CM) - Other abnormalities of gait and mobility  R53.1 (ICD-10-CM) - Weakness    THERAPY DIAG:  Difficulty in walking, not elsewhere classified  Muscle weakness (generalized)  Unsteadiness on feet  Rationale for Evaluation and Treatment Rehabilitation  SUBJECTIVE:                                                                                                                                                                                             SUBJECTIVE STATEMENT:  Pt reports he  has been doing exercises at home with his wife helping remind him to perform.  Pt accompanied by:  Harlin Rain  PERTINENT HISTORY: Pt's wife reporting that pt has rounded shoulders/stooped posture that has progressively gotten worse over the past few years.  Pt has been diagnosed with [name] dementia as well which is affecting memory and movement patterns according to the pt and his spouse.  Pt denies needing to come to therapy  PAIN:  Are you having pain? No; only while sitting still. Worst Pain: 10/10 occasionally, when walking  or doing something like pushing the grocery cart Best Pain: 0/10   PRECAUTIONS: None  WEIGHT BEARING RESTRICTIONS: No  FALLS: Has patient fallen in last 6 months? No; 4-5 near misses  LIVING ENVIRONMENT: Lives with: lives with their spouse Lives in: House/apartment Stairs: Yes: Internal: 15 steps; on right going up and External: 2 steps; on right going up First step does not have a rail. Has following equipment at home: Single point cane  PLOF: Independent  PATIENT GOALS: Prevent falls, and strengthen legs while decreasing your pain.  OBJECTIVE:   DIAGNOSTIC FINDINGS:   EXAM: MRI HEAD WITHOUT CONTRAST   IMPRESSION: No acute intracranial process. Overall cerebral atrophy is likely within normal limits for age, with possible slightly more advanced atrophy in the left-greater-than-right temporal lobe.  Pt has an X-ray from a different clinic that is not in current Epic Chart.   COGNITION: Overall cognitive status: Within functional limits for tasks assessed   SENSATION: WFL  COORDINATION: WFL  POSTURE: rounded shoulders, forward head, and flexed trunk   LOWER EXTREMITY ROM:     WFL  LOWER EXTREMITY MMT:    MMT Right Eval Left Eval  Hip flexion 5 4+  Hip extension 5 4+  Hip abduction 5 4+  Hip adduction 5 4+  Knee flexion 5 4  Knee extension 5 4+  Ankle dorsiflexion 5 5  (Blank rows = not tested)   GAIT: Gait  pattern: shuffling, decreased trunk rotation, trunk flexed, narrow BOS, poor foot clearance- Right, and poor foot clearance- Left Assistive device utilized: None Level of assistance: SBA Comments: Pt ambulates with significant shuffling at times and has decreased foot clearance which would be indicative of increased risk of falling.  FUNCTIONAL TESTS:  5 times sit to stand: 12.91 with UE support; 15.14 Timed up and go (TUG): 15.94 sec 6 minute walk test: 1231 ft 10 meter walk test: 13.05 sec Functional gait assessment: 17/30  PATIENT SURVEYS:  FOTO 49; Goal: 54   TODAY'S TREATMENT:                                                                                   TherEx:  Seated LAQ with yellow physioball between LE's, 2x15 Seated hip adduction into rainbow physioball, 5 sec holds, 2x15 Standing hip abduction with GTB around distal thigh for resistance, 2x15 each LE Standing lateral walks with GTB around distal thigh for resistance, 12 ft each direction Seated glute squeezes, 3 sec holds, 2x15 Prone press-ups on elbows, 30 sec bouts x2 with pt noting some increased pain in the R triceps region Prone hip extension, attempted but unable to perform against gravity Prone quad stretch, 30 sec bouts x2 each LE   PATIENT EDUCATION: Education details: Pt and spouse educated on role of PT and services provided during POC. Person educated: Patient and Spouse Education method: Explanation Education comprehension: verbalized understanding  HOME EXERCISE PROGRAM:  Access Code: [email or web address] URL: [email or web address] Date: 02/07/2022 Prepared by: Tomasa Hose  Exercises - Prone Press Up On Elbows  - 1 x daily - 7 x weekly - 3 sets - 10 reps - Standing Back Extension  - 1 x daily - 7 x weekly -  3 sets - 10 reps - Seated Gluteal Sets  - 1 x daily - 7 x weekly - 3 sets - 10 reps    GOALS: Goals reviewed with patient? Yes  SHORT TERM GOALS: Target date: 03/07/2022  1.   Pt will be independent with HEP in order to demonstrate increased ability to perform tasks related to occupation/hobbies. Baseline: HEP to be given at next visit Goal status: INITIAL   LONG TERM GOALS: Target date: 05/02/2022  1.  Patient (> 75 years old) will complete five times sit to stand test in < 15 seconds indicating an increased LE strength and improved balance. Baseline: 15.14 with no UE support Goal status: INITIAL  2.  Patient will increase FOTO score to equal to or greater than 54 to demonstrate statistically significant improvement in mobility and quality of life.  Baseline: 49 Goal status: INITIAL   3.  Patient will increase FGA score by > 4 points to demonstrate decreased fall risk during functional activities. Baseline: 17/30 Goal status: INITIAL   4.  Patient will reduce timed up and go to <11 seconds to reduce fall risk and demonstrate improved transfer/gait ability. Baseline: 15.94 sec Goal status: INITIAL  5.  Patient will increase 10 meter walk test to >1.71m/s as to improve gait speed for better community ambulation and to reduce fall risk. Baseline: Not given at IE Goal status: INITIAL  6.  Patient will increase six minute walk test distance to >1000 for progression to community ambulator and improve gait ability Baseline: 1232 ft at Eval Goal status: INITIAL  ASSESSMENT:  CLINICAL IMPRESSION:  Pt continues to demonstrate significant weakness with the posterior chain during exercises, most notable hip extension.  Pt unable to perform movement against gravity at this time.  Pt advised to perform other glute strengthening exercises as part of HEP.  Pt and spouse agreeable.   Pt will continue to benefit from skilled therapy to address remaining deficits in order to improve overall QoL and return to PLOF.       OBJECTIVE IMPAIRMENTS: Abnormal gait, decreased activity tolerance, decreased balance, difficulty walking, decreased strength, decreased safety  awareness, and pain.   ACTIVITY LIMITATIONS: carrying, lifting, bending, standing, and squatting  PARTICIPATION LIMITATIONS: yard work  PERSONAL FACTORS: Age, Fitness, Past/current experiences, and Time since onset of injury/illness/exacerbation are also affecting patient's functional outcome.   REHAB POTENTIAL: Good  CLINICAL DECISION MAKING: Stable/uncomplicated  EVALUATION COMPLEXITY: Low  PLAN:  PT FREQUENCY: 2x/week  PT DURATION: 12 weeks  PLANNED INTERVENTIONS: Therapeutic exercises, Therapeutic activity, Neuromuscular re-education, Balance training, Gait training, Patient/Family education, Self Care, Joint mobilization, DME instructions, Dry Needling, and Manual therapy  PLAN FOR NEXT SESSION:  Continue with strengthening and postural control.  Assess for HEP compliance and merge two HEP's if still appropriate.   Nolon Bussing, PT, DPT Physical Therapist- [facility]  02/07/22, 5:12 PM

## 2022-02-09 ENCOUNTER — Ambulatory Visit: Payer: Medicare Other | Admitting: Speech Pathology

## 2022-02-09 ENCOUNTER — Ambulatory Visit: Payer: Medicare Other | Attending: Internal Medicine

## 2022-02-09 DIAGNOSIS — R41841 Cognitive communication deficit: Secondary | ICD-10-CM

## 2022-02-09 DIAGNOSIS — G309 Alzheimer's disease, unspecified: Secondary | ICD-10-CM | POA: Diagnosis present

## 2022-02-09 DIAGNOSIS — F0280 Dementia in other diseases classified elsewhere without behavioral disturbance: Secondary | ICD-10-CM | POA: Diagnosis present

## 2022-02-09 DIAGNOSIS — F0150 Vascular dementia without behavioral disturbance: Secondary | ICD-10-CM | POA: Insufficient documentation

## 2022-02-09 DIAGNOSIS — R2681 Unsteadiness on feet: Secondary | ICD-10-CM | POA: Diagnosis present

## 2022-02-09 DIAGNOSIS — M6281 Muscle weakness (generalized): Secondary | ICD-10-CM | POA: Insufficient documentation

## 2022-02-09 DIAGNOSIS — R262 Difficulty in walking, not elsewhere classified: Secondary | ICD-10-CM | POA: Insufficient documentation

## 2022-02-09 NOTE — Therapy (Signed)
OUTPATIENT PHYSICAL THERAPY NEURO TREATMENT   Patient Name: [place] Mcdonald MRN: [id number] DOB:February 07, 1935, 87 y.o., male Today's Date: 02/09/2022   PCP: Idelle Crouch, MD REFERRING PROVIDER: Idelle Crouch, MD   PT End of Session - 02/09/22 1305     Visit Number 4    Number of Visits 24    Date for PT Re-Evaluation 04/25/22    Authorization Time Period 63/87/56 - 4/33/29 cert date    Progress Note Due on Visit 10    PT Start Time 1303    PT Stop Time 1345    PT Time Calculation (min) 42 min               Past Medical History:  Diagnosis Date   GERD (gastroesophageal reflux disease)    Hyperlipidemia    Wears hearing aid in both ears    Past Surgical History:  Procedure Laterality Date   CATARACT EXTRACTION W/PHACO Left 05/22/2018   Procedure: CATARACT EXTRACTION PHACO AND INTRAOCULAR LENS PLACEMENT ([place])  COMPLICATED LEFT TORIC LENS;  Surgeon: Leandrew Koyanagi, MD;  Location: [place];  Service: Ophthalmology;  Laterality: Left;  MALYUGIN   CATARACT EXTRACTION W/PHACO Right 06/12/2018   Procedure: CATARACT EXTRACTION PHACO AND INTRAOCULAR LENS PLACEMENT ([place])  RIGHT TORIC LENS;  Surgeon: Leandrew Koyanagi, MD;  Location: [place];  Service: Ophthalmology;  Laterality: Right;   TONSILLECTOMY     There are no problems to display for this patient.   ONSET DATE: 2 years  REFERRING DIAG:   R41.3 (ICD-10-CM) - Other amnesia  R26.89 (ICD-10-CM) - Other abnormalities of gait and mobility  R53.1 (ICD-10-CM) - Weakness    THERAPY DIAG:  Difficulty in walking, not elsewhere classified  Muscle weakness (generalized)  Unsteadiness on feet  Rationale for Evaluation and Treatment Rehabilitation  SUBJECTIVE:                                                                                                                                                                                             SUBJECTIVE STATEMENT:  Pt notes he is  doing better with his balance and he attributes the improvement from physical therapy.  Pt notes he took a shower this morning and he didn't have to hold on to shower which is a vast improvement.  Pt accompanied by:  Harlin Rain  PERTINENT HISTORY: Pt's wife reporting that pt has rounded shoulders/stooped posture that has progressively gotten worse over the past few years.  Pt has been diagnosed with [name] dementia as well which is affecting memory and movement patterns according to the pt and his spouse.  Pt  denies needing to come to therapy.  PAIN:  Are you having pain? No; only while sitting still. Worst Pain: 10/10 occasionally, when walking or doing something like pushing the grocery cart Best Pain: 0/10   PRECAUTIONS: None  WEIGHT BEARING RESTRICTIONS: No  FALLS: Has patient fallen in last 6 months? No; 4-5 near misses  LIVING ENVIRONMENT: Lives with: lives with their spouse Lives in: House/apartment Stairs: Yes: Internal: 15 steps; on right going up and External: 2 steps; on right going up First step does not have a rail. Has following equipment at home: Single point cane  PLOF: Independent  PATIENT GOALS: Prevent falls, and strengthen legs while decreasing your pain.  OBJECTIVE:   DIAGNOSTIC FINDINGS:   EXAM: MRI HEAD WITHOUT CONTRAST   IMPRESSION: No acute intracranial process. Overall cerebral atrophy is likely within normal limits for age, with possible slightly more advanced atrophy in the left-greater-than-right temporal lobe.  Pt has an X-ray from a different clinic that is not in current Epic Chart.   COGNITION: Overall cognitive status: Within functional limits for tasks assessed   SENSATION: WFL  COORDINATION: WFL  POSTURE: rounded shoulders, forward head, and flexed trunk   LOWER EXTREMITY ROM:     WFL  LOWER EXTREMITY MMT:    MMT Right Eval Left Eval  Hip flexion 5 4+  Hip extension 5 4+  Hip abduction 5 4+  Hip adduction 5  4+  Knee flexion 5 4  Knee extension 5 4+  Ankle dorsiflexion 5 5  (Blank rows = not tested)   GAIT: Gait pattern: shuffling, decreased trunk rotation, trunk flexed, narrow BOS, poor foot clearance- Right, and poor foot clearance- Left Assistive device utilized: None Level of assistance: SBA Comments: Pt ambulates with significant shuffling at times and has decreased foot clearance which would be indicative of increased risk of falling.  FUNCTIONAL TESTS:  5 times sit to stand: 12.91 with UE support; 15.14 Timed up and go (TUG): 15.94 sec 6 minute walk test: 1231 ft 10 meter walk test: 13.05 sec Functional gait assessment: 17/30  PATIENT SURVEYS:  FOTO 49; Goal: 54   TODAY'S TREATMENT:                                                                                    TherEx:  Seated LAQ with yellow physioball between LE's, 2x15 Seated hip adduction into rainbow physioball, 5 sec holds, 2x15 Seated hip abduction into BTB around distal thigh for resistance, 2x15 Prone quad stretch, 30 sec bouts x2 each LE Seated trunk extension with BTB in each hand, therapist providing resistance, flexing/extending at trunk for core stability, 2x15 Standing pallof press, 7.5#, 2x10 each direction Cable column walk-outs, 12.5#, x5 forward/backward   PATIENT EDUCATION: Education details: Pt and spouse educated on role of PT and services provided during POC. Person educated: Patient and Spouse Education method: Explanation Education comprehension: verbalized understanding  HOME EXERCISE PROGRAM:  Access Code: [email or web address] URL: [email or web address] Date: 02/07/2022 Prepared by: Tomasa Hose  Exercises - Prone Press Up On Elbows  - 1 x daily - 7 x weekly - 3 sets - 10 reps - Standing Back Extension  - 1 x daily -  7 x weekly - 3 sets - 10 reps - Seated Gluteal Sets  - 1 x daily - 7 x weekly - 3 sets - 10 reps    GOALS: Goals reviewed with patient? Yes  SHORT TERM  GOALS: Target date: 03/09/2022  1.  Pt will be independent with HEP in order to demonstrate increased ability to perform tasks related to occupation/hobbies. Baseline: HEP to be given at next visit Goal status: INITIAL   LONG TERM GOALS: Target date: 05/04/2022  1.  Patient (> 19 years old) will complete five times sit to stand test in < 15 seconds indicating an increased LE strength and improved balance. Baseline: 15.14 with no UE support Goal status: INITIAL  2.  Patient will increase FOTO score to equal to or greater than 54 to demonstrate statistically significant improvement in mobility and quality of life.  Baseline: 49 Goal status: INITIAL   3.  Patient will increase FGA score by > 4 points to demonstrate decreased fall risk during functional activities. Baseline: 17/30 Goal status: INITIAL   4.  Patient will reduce timed up and go to <11 seconds to reduce fall risk and demonstrate improved transfer/gait ability. Baseline: 15.94 sec Goal status: INITIAL  5.  Patient will increase 10 meter walk test to >1.3m/s as to improve gait speed for better community ambulation and to reduce fall risk. Baseline: Not given at IE Goal status: INITIAL  6.  Patient will increase six minute walk test distance to >1500 for progression to community ambulator and improve gait ability Baseline: 1232 ft at Eval Goal status: UPDATED  ASSESSMENT:  CLINICAL IMPRESSION:  Pt continues to put forth good effort during therapy and is improving strength from session to session.  Pt introduced to trunk extension today in order to improve overall lumbar mobility and posture necessary for proper ambulation and other tasks.  Pt would also benefit from increased core activities for improved lumbar stability going forward as well.   Pt will continue to benefit from skilled therapy to address remaining deficits in order to improve overall QoL and return to PLOF.       OBJECTIVE IMPAIRMENTS: Abnormal gait,  decreased activity tolerance, decreased balance, difficulty walking, decreased strength, decreased safety awareness, and pain.   ACTIVITY LIMITATIONS: carrying, lifting, bending, standing, and squatting  PARTICIPATION LIMITATIONS: yard work  PERSONAL FACTORS: Age, Fitness, Past/current experiences, and Time since onset of injury/illness/exacerbation are also affecting patient's functional outcome.   REHAB POTENTIAL: Good  CLINICAL DECISION MAKING: Stable/uncomplicated  EVALUATION COMPLEXITY: Low  PLAN:  PT FREQUENCY: 2x/week  PT DURATION: 12 weeks  PLANNED INTERVENTIONS: Therapeutic exercises, Therapeutic activity, Neuromuscular re-education, Balance training, Gait training, Patient/Family education, Self Care, Joint mobilization, DME instructions, Dry Needling, and Manual therapy   PLAN FOR NEXT SESSION:   Continue with strengthening and postural control.  Assess for HEP compliance and merge two HEP's if still appropriate.    Nolon Bussing, PT, DPT Physical Therapist- [facility]  02/09/22, 1:09 PM

## 2022-02-14 ENCOUNTER — Ambulatory Visit: Payer: Medicare Other | Admitting: Speech Pathology

## 2022-02-14 ENCOUNTER — Ambulatory Visit: Payer: Medicare Other

## 2022-02-14 DIAGNOSIS — R41841 Cognitive communication deficit: Secondary | ICD-10-CM

## 2022-02-14 DIAGNOSIS — M6281 Muscle weakness (generalized): Secondary | ICD-10-CM

## 2022-02-14 DIAGNOSIS — R262 Difficulty in walking, not elsewhere classified: Secondary | ICD-10-CM

## 2022-02-14 DIAGNOSIS — F0280 Dementia in other diseases classified elsewhere without behavioral disturbance: Secondary | ICD-10-CM

## 2022-02-14 NOTE — Therapy (Signed)
OUTPATIENT SPEECH LANGUAGE PATHOLOGY  COGNITION EVALUATION   Patient Name: [facility] Mcdonald MRN: [id number] DOB:July 24, 1934, 87 y.o., male Today's Date: 02/14/2022  PCP: [place] Reek, MD REFERRING PROVIDER: Adolfo Books, MD   End of Session - 02/14/22 1427     Visit Number 1    Number of Visits 9    Date for SLP Re-Evaluation 03/14/22    Authorization Type Medicare A/Medicare B    Progress Note Due on Visit 10    SLP Start Time 1200    SLP Stop Time  1300    SLP Time Calculation (min) 60 min    Activity Tolerance Patient tolerated treatment well             Past Medical History:  Diagnosis Date   GERD (gastroesophageal reflux disease)    Hyperlipidemia    Wears hearing aid in both ears    Past Surgical History:  Procedure Laterality Date   CATARACT EXTRACTION W/PHACO Left 05/22/2018   Procedure: CATARACT EXTRACTION PHACO AND INTRAOCULAR LENS PLACEMENT ([place])  COMPLICATED LEFT TORIC LENS;  Surgeon: [place] Koyanagi, MD;  Location: [place];  Service: Ophthalmology;  Laterality: Left;  MALYUGIN   CATARACT EXTRACTION W/PHACO Right 06/12/2018   Procedure: CATARACT EXTRACTION PHACO AND INTRAOCULAR LENS PLACEMENT ([place])  RIGHT TORIC LENS;  Surgeon: [place] Koyanagi, MD;  Location: [place];  Service: Ophthalmology;  Laterality: Right;   TONSILLECTOMY     There are no problems to display for this patient.   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 Wife began noticing decline in memory around 2022 with change in behavior as pt is more withdrawn.   DIAGNOSTIC FINDINGS: MRI Brain 11/01/2021: No acute intracranial process. Overall cerebral atrophy is likely within normal limits for age, with possible slightly more advanced atrophy in the left-greater-than-right temporal lobe.  PAIN:  Are you having pain? No   FALLS: Has patient fallen in last 6 months?  No  LIVING ENVIRONMENT: Lives with: lives with their spouse Lives in: House/apartment  PLOF:  Level of assistance: Needed assistance with ADLs, Needed assistance with IADLS Employment: Retired   PATIENT GOALS learn about diagnosis  OBJECTIVE:   COGNITIVE COMMUNICATION Overall cognitive status: Impaired Functional communication: impaired by lower level cognitive deficits Functional deficits: pt is dependent on his wife for basic cognitive tasks  ORAL MOTOR EXAMINATION Facial : WFL Lingual: WFL Velum: WFL Mandible: WFL Cough: WFL Voice: WFL   STANDARDIZED ASSESSMENTS: Unable to provided d/t severity of deficits - pt with 0 on all subtests  PATIENT REPORTED OUTCOME MEASURES (PROM): Pt unable to complete     PATIENT EDUCATION: Education details: short course of skilled ST to provide education  on memory strategies Person educated: Patient and Spouse Education method: Explanation, Demonstration, and Handouts Education  comprehension: verbalized understanding and needs further education     GOALS: Goals reviewed with patient? Yes  SHORT TERM GOALS: Target date: 10 sessions  Pt and his wife will demonstrate use of compensatory memory aids.  Baseline: Goal status: INITIAL  LONG TERM GOALS: Target date: 03/14/2022 Pt and his wife with use memory aids to orient pt to family members' names and relationships.  Baseline:  Goal status: INITIAL   ASSESSMENT:  CLINICAL IMPRESSION: Patient is a 87 y.o. male who was seen today for a cognitive communication evaluation. Pt presents with significant memory loss and is dependent on his wife for all cognitive tasks.    OBJECTIVE IMPAIRMENTS include attention, memory, awareness, and executive functioning. These impairments are limiting patient from ADLs/IADLs and effectively communicating at home and in community. Factors affecting potential to achieve goals and functional outcome are ability to learn/carryover information, co-morbidities, medical prognosis, previous level of function, and severity of impairments.. Patient will benefit from skilled SLP services to address above impairments and improve overall function.  REHAB POTENTIAL: Good  PLAN: SLP FREQUENCY: 1-2x/week  SLP DURATION: 4 weeks  PLANNED INTERVENTIONS: Internal/external aids, SLP instruction and feedback, Compensatory strategies, and Patient/family education   [place] Mcdonald, M.S., CCC-SLP, Mining engineer Certified Brain Injury [place]  [place] Office [phone] Ascom [phone] Fax [phone]

## 2022-02-14 NOTE — Therapy (Unsigned)
OUTPATIENT SPEECH LANGUAGE PATHOLOGY TREATMENT NOTE/DISCHARGE SUMMARY   Patient Name: [place] Mcdonald MRN: [id number] DOB:1934-07-03, 87 y.o., male Today's Date: 02/14/2022  PCP: [place] Reek, MD REFERRING PROVIDER: Lavone Books, MD  END OF SESSION:   End of Session - 02/14/22 1558     Visit Number 2    Number of Visits 9    Date for SLP Re-Evaluation 03/14/22    Authorization Type Medicare A/Medicare B    Progress Note Due on Visit 10    SLP Start Time 1500    SLP Stop Time  1600    SLP Time Calculation (min) 60 min    Activity Tolerance Patient tolerated treatment well             Past Medical History:  Diagnosis Date   GERD (gastroesophageal reflux disease)    Hyperlipidemia    Wears hearing aid in both ears    Past Surgical History:  Procedure Laterality Date   CATARACT EXTRACTION W/PHACO Left 05/22/2018   Procedure: CATARACT EXTRACTION PHACO AND INTRAOCULAR LENS PLACEMENT ([place])  COMPLICATED LEFT TORIC LENS;  Surgeon: Leandrew Koyanagi, MD;  Location: [place];  Service: Ophthalmology;  Laterality: Left;  MALYUGIN   CATARACT EXTRACTION W/PHACO Right 06/12/2018   Procedure: CATARACT EXTRACTION PHACO AND INTRAOCULAR LENS PLACEMENT ([place])  RIGHT TORIC LENS;  Surgeon: Leandrew Koyanagi, MD;  Location: [place];  Service: Ophthalmology;  Laterality: Right;   TONSILLECTOMY     There are no problems to display for this patient.   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 No  PATIENT GOALS: to learn more about ways to improve pt's abilities  OBJECTIVE:   TODAY'S TREATMENT: Skilled treatment session focused on providing education on diagnosis and cognitive training. SLP facilitated session by providing written information regarding ways to keep pt's mind active. These recommendations included: daily exercise, creating a routine/schedule, safety for caregiver such as a Oceanographer, participation in community activities, completing ADLs as well as household tasks, reduce time spent in front of TV. Also suggested use of memory book with review of family members prior to gathering for a family event. Pt's wife voices understanding of diagnosis (dementia) and was able to return demonstrate the above concepts. She even created some therapeutic activities within session.       Home Exercise Program  Engage in the above activities   PATIENT EDUCATION: Education details: cognitive training Person educated: Patient and Spouse Education method: Explanation, Demonstration, Verbal cues, and Handouts Education comprehension: verbalized understanding and returned demonstration  GOALS: Goals reviewed with patient? Yes   SHORT TERM GOALS: Target date: 10 sessions   Pt and his wife will demonstrate use of compensatory memory aids.  Baseline: Goal status: MET - 02/14/2022   LONG TERM GOALS: Target date: 03/14/2022 Pt and his wife with use memory aids to orient pt to family members' names and relationships.  Baseline:  Goal status: MET - 02/14/2022      ASSESSMENT:  CLINICAL IMPRESSION:   Pt and his wife are eager to engage in the above recommendations. Both arrived to course of ST with advanced knowledge of  diagnosis as well as advanced understanding of cognitive impairments accompanying diagnosis. Therefore a short course of ST has been completed. Further skilled ST intervention doesn't appear indicated at this time.   Jakiah Bienaime B. Rutherford Nail, M.S., CCC-SLP, Mining engineer Certified Brain Injury [place]  [place] Office [phone] Ascom [phone] Fax [phone]

## 2022-02-14 NOTE — Therapy (Signed)
OUTPATIENT PHYSICAL THERAPY NEURO TREATMENT   Patient Name: Willie Mcdonald MRN: [id number] DOB:11/12/1934, 87 y.o., male Today's Date: 02/14/2022   PCP: Marguarite Arbour, MD REFERRING PROVIDER: Marguarite Arbour, MD   PT End of Session - 02/14/22 1603     Visit Number 5    Number of Visits 24    Date for PT Re-Evaluation 04/25/22    Authorization Time Period 01/31/22 - 5/36/64 cert date    Progress Note Due on Visit 10    PT Start Time 1600    PT Stop Time 1644    PT Time Calculation (min) 44 min    Equipment Utilized During Treatment Gait belt    Activity Tolerance Patient tolerated treatment well                Past Medical History:  Diagnosis Date   GERD (gastroesophageal reflux disease)    Hyperlipidemia    Wears hearing aid in both ears    Past Surgical History:  Procedure Laterality Date   CATARACT EXTRACTION W/PHACO Left 05/22/2018   Procedure: CATARACT EXTRACTION PHACO AND INTRAOCULAR LENS PLACEMENT (IOC)  COMPLICATED LEFT TORIC LENS;  Surgeon: Lockie Mola, MD;  Location: [facility];  Service: Ophthalmology;  Laterality: Left;  MALYUGIN   CATARACT EXTRACTION W/PHACO Right 06/12/2018   Procedure: CATARACT EXTRACTION PHACO AND INTRAOCULAR LENS PLACEMENT (IOC)  RIGHT TORIC LENS;  Surgeon: Lockie Mola, MD;  Location: [facility];  Service: Ophthalmology;  Laterality: Right;   TONSILLECTOMY     There are no problems to display for this patient.   ONSET DATE: 2 years  REFERRING DIAG:   R41.3 (ICD-10-CM) - Other amnesia  R26.89 (ICD-10-CM) - Other abnormalities of gait and mobility  R53.1 (ICD-10-CM) - Weakness    THERAPY DIAG:  Difficulty in walking, not elsewhere classified  Muscle weakness (generalized)  Rationale for Evaluation and Treatment Rehabilitation  SUBJECTIVE:                                                                                                                                                                                              SUBJECTIVE STATEMENT:  Patient reports no pain or LOB since last session. Has been compliant intermittently with HEP  Pt accompanied by:  Leonette Nutting  PERTINENT HISTORY: Pt's wife reporting that pt has rounded shoulders/stooped posture that has progressively gotten worse over the past few years.  Pt has been diagnosed with [name] dementia as well which is affecting memory and movement patterns according to the pt and his spouse.  Pt denies needing to come to therapy.  PAIN:  Are  you having pain? No; only while sitting still. Worst Pain: 10/10 occasionally, when walking or doing something like pushing the grocery cart Best Pain: 0/10   PRECAUTIONS: None  WEIGHT BEARING RESTRICTIONS: No  FALLS: Has patient fallen in last 6 months? No; 4-5 near misses  LIVING ENVIRONMENT: Lives with: lives with their spouse Lives in: House/apartment Stairs: Yes: Internal: 15 steps; on right going up and External: 2 steps; on right going up First step does not have a rail. Has following equipment at home: Single point cane  PLOF: Independent  PATIENT GOALS: Prevent falls, and strengthen legs while decreasing your pain.  OBJECTIVE:   DIAGNOSTIC FINDINGS:   EXAM: MRI HEAD WITHOUT CONTRAST   IMPRESSION: No acute intracranial process. Overall cerebral atrophy is likely within normal limits for age, with possible slightly more advanced atrophy in the left-greater-than-right temporal lobe.  Pt has an X-ray from a different clinic that is not in current Epic Chart.   COGNITION: Overall cognitive status: Within functional limits for tasks assessed   SENSATION: WFL  COORDINATION: WFL  POSTURE: rounded shoulders, forward head, and flexed trunk   LOWER EXTREMITY ROM:     WFL  LOWER EXTREMITY MMT:    MMT Right Eval Left Eval  Hip flexion 5 4+  Hip extension 5 4+  Hip abduction 5 4+  Hip adduction 5 4+  Knee flexion 5 4  Knee  extension 5 4+  Ankle dorsiflexion 5 5  (Blank rows = not tested)   GAIT: Gait pattern: shuffling, decreased trunk rotation, trunk flexed, narrow BOS, poor foot clearance- Right, and poor foot clearance- Left Assistive device utilized: None Level of assistance: SBA Comments: Pt ambulates with significant shuffling at times and has decreased foot clearance which would be indicative of increased risk of falling.  FUNCTIONAL TESTS:  5 times sit to stand: 12.91 with UE support; 15.14 Timed up and go (TUG): 15.94 sec 6 minute walk test: 1231 ft 10 meter walk test: 13.05 sec Functional gait assessment: 17/30  PATIENT SURVEYS:  FOTO 49; Goal: 54   TODAY'S TREATMENT:                                                                                    ambulate across stable and unstable surface outside. Negotiating changing surfaces from grass to sidewalk, across brick with turns and obstacles in pathway without LOB.x15 minutes; Moderate cueing for large steps due to tendency to shorten step length with dual task.   In // bars: RTB standing row 12x RTB straight arm lat pull down 10x  Balloon taps x 3 minutes for coordination and spatial awareness with reaction timing  Seated:  RTB hamstring curl 15x each LE Seated on dynadisc:  -static sit 30 seconds -UE raises 10x each arm  Seated TrA activation with large swiss 10x 3 second holds Seated TrA activation with UE raise 10x each LE  Seated trunk extensions 10x arms crossed    PATIENT EDUCATION: Education details: Pt and spouse educated on role of PT and services provided during POC. Person educated: Patient and Spouse Education method: Explanation Education comprehension: verbalized understanding  HOME EXERCISE PROGRAM:  Access Code: [email or web address] URL: [email or web address]  Date: 02/07/2022 Prepared by: Tomasa Hose  Exercises - Prone Press Up On Elbows  - 1 x daily - 7 x weekly - 3 sets - 10 reps - Standing Back  Extension  - 1 x daily - 7 x weekly - 3 sets - 10 reps - Seated Gluteal Sets  - 1 x daily - 7 x weekly - 3 sets - 10 reps    GOALS: Goals reviewed with patient? Yes  SHORT TERM GOALS: Target date: 03/14/2022  1.  Pt will be independent with HEP in order to demonstrate increased ability to perform tasks related to occupation/hobbies. Baseline: HEP to be given at next visit Goal status: INITIAL   LONG TERM GOALS: Target date: 05/09/2022  1.  Patient (> 47 years old) will complete five times sit to stand test in < 15 seconds indicating an increased LE strength and improved balance. Baseline: 15.14 with no UE support Goal status: INITIAL  2.  Patient will increase FOTO score to equal to or greater than 54 to demonstrate statistically significant improvement in mobility and quality of life.  Baseline: 49 Goal status: INITIAL   3.  Patient will increase FGA score by > 4 points to demonstrate decreased fall risk during functional activities. Baseline: 17/30 Goal status: INITIAL   4.  Patient will reduce timed up and go to <11 seconds to reduce fall risk and demonstrate improved transfer/gait ability. Baseline: 15.94 sec Goal status: INITIAL  5.  Patient will increase 10 meter walk test to >1.60m/s as to improve gait speed for better community ambulation and to reduce fall risk. Baseline: Not given at IE Goal status: INITIAL  6.  Patient will increase six minute walk test distance to >1500 for progression to community ambulator and improve gait ability Baseline: 1232 ft at Eval Goal status: UPDATED  ASSESSMENT:  CLINICAL IMPRESSION:  Patient presents with excellent motivation. He is able to ambulate across stable and unstable surfaces in outside surfaces without LOB; intermediate cueing for large steps required. Standing and seated postural interventions tolerated well with no increase in pain and no instability. Standing balloon taps performed with patient integrating visual and  spatial cueing.  Pt will continue to benefit from skilled therapy to address remaining deficits in order to improve overall QoL and return to PLOF.       OBJECTIVE IMPAIRMENTS: Abnormal gait, decreased activity tolerance, decreased balance, difficulty walking, decreased strength, decreased safety awareness, and pain.   ACTIVITY LIMITATIONS: carrying, lifting, bending, standing, and squatting  PARTICIPATION LIMITATIONS: yard work  PERSONAL FACTORS: Age, Fitness, Past/current experiences, and Time since onset of injury/illness/exacerbation are also affecting patient's functional outcome.   REHAB POTENTIAL: Good  CLINICAL DECISION MAKING: Stable/uncomplicated  EVALUATION COMPLEXITY: Low  PLAN:  PT FREQUENCY: 2x/week  PT DURATION: 12 weeks  PLANNED INTERVENTIONS: Therapeutic exercises, Therapeutic activity, Neuromuscular re-education, Balance training, Gait training, Patient/Family education, Self Care, Joint mobilization, DME instructions, Dry Needling, and Manual therapy   PLAN FOR NEXT SESSION:   Continue with strengthening and postural control.  Assess for HEP compliance and merge two HEP's if still appropriate.   Precious Bard PT  Physical Therapist- [facility]  02/14/22, 4:53 PM

## 2022-02-16 ENCOUNTER — Ambulatory Visit: Payer: Medicare Other

## 2022-02-16 ENCOUNTER — Ambulatory Visit: Payer: Medicare Other | Admitting: Speech Pathology

## 2022-02-16 DIAGNOSIS — M6281 Muscle weakness (generalized): Secondary | ICD-10-CM

## 2022-02-16 DIAGNOSIS — R262 Difficulty in walking, not elsewhere classified: Secondary | ICD-10-CM

## 2022-02-16 DIAGNOSIS — R2681 Unsteadiness on feet: Secondary | ICD-10-CM

## 2022-02-16 NOTE — Therapy (Signed)
OUTPATIENT PHYSICAL THERAPY NEURO TREATMENT   Patient Name: Willie Mcdonald MRN: [id number] DOB:12-25-1934, 87 y.o., male Today's Date: 02/16/2022   PCP: Marguarite Arbour, MD REFERRING PROVIDER: Marguarite Arbour, MD   PT End of Session - 02/16/22 1555     Visit Number 6    Number of Visits 24    Date for PT Re-Evaluation 04/25/22    Authorization Time Period 01/31/22 - 9/52/84 cert date    Progress Note Due on Visit 10    PT Start Time 1555    PT Stop Time 1645    PT Time Calculation (min) 50 min    Equipment Utilized During Treatment Gait belt    Activity Tolerance Patient tolerated treatment well              Past Medical History:  Diagnosis Date   GERD (gastroesophageal reflux disease)    Hyperlipidemia    Wears hearing aid in both ears    Past Surgical History:  Procedure Laterality Date   CATARACT EXTRACTION W/PHACO Left 05/22/2018   Procedure: CATARACT EXTRACTION PHACO AND INTRAOCULAR LENS PLACEMENT (IOC)  COMPLICATED LEFT TORIC LENS;  Surgeon: Lockie Mola, MD;  Location: [facility];  Service: Ophthalmology;  Laterality: Left;  MALYUGIN   CATARACT EXTRACTION W/PHACO Right 06/12/2018   Procedure: CATARACT EXTRACTION PHACO AND INTRAOCULAR LENS PLACEMENT (IOC)  RIGHT TORIC LENS;  Surgeon: Lockie Mola, MD;  Location: [facility];  Service: Ophthalmology;  Laterality: Right;   TONSILLECTOMY     There are no problems to display for this patient.   ONSET DATE: 2 years  REFERRING DIAG:   R41.3 (ICD-10-CM) - Other amnesia  R26.89 (ICD-10-CM) - Other abnormalities of gait and mobility  R53.1 (ICD-10-CM) - Weakness    THERAPY DIAG:  Difficulty in walking, not elsewhere classified  Muscle weakness (generalized)  Unsteadiness on feet  Rationale for Evaluation and Treatment Rehabilitation  SUBJECTIVE:                                                                                                                                                                                              SUBJECTIVE STATEMENT:  Pt reports he is doing well.  Pt reports he had a pretty uneventful day sitting in his chair for majority of the day.    Pt accompanied by:  Leonette Nutting  PERTINENT HISTORY: Pt's wife reporting that pt has rounded shoulders/stooped posture that has progressively gotten worse over the past few years.  Pt has been diagnosed with [name] dementia as well which is affecting memory and movement patterns according to the pt and his  spouse.  Pt denies needing to come to therapy.  PAIN:  Are you having pain? No; only while sitting still. Worst Pain: 10/10 occasionally, when walking or doing something like pushing the grocery cart Best Pain: 0/10   PRECAUTIONS: None  WEIGHT BEARING RESTRICTIONS: No  FALLS: Has patient fallen in last 6 months? No; 4-5 near misses  LIVING ENVIRONMENT: Lives with: lives with their spouse Lives in: House/apartment Stairs: Yes: Internal: 15 steps; on right going up and External: 2 steps; on right going up First step does not have a rail. Has following equipment at home: Single point cane  PLOF: Independent  PATIENT GOALS: Prevent falls, and strengthen legs while decreasing your pain.  OBJECTIVE:   DIAGNOSTIC FINDINGS:   EXAM: MRI HEAD WITHOUT CONTRAST   IMPRESSION: No acute intracranial process. Overall cerebral atrophy is likely within normal limits for age, with possible slightly more advanced atrophy in the left-greater-than-right temporal lobe.  Pt has an X-ray from a different clinic that is not in current Epic Chart.   COGNITION: Overall cognitive status: Within functional limits for tasks assessed   SENSATION: WFL  COORDINATION: WFL  POSTURE: rounded shoulders, forward head, and flexed trunk   LOWER EXTREMITY ROM:     WFL  LOWER EXTREMITY MMT:    MMT Right Eval Left Eval  Hip flexion 5 4+  Hip extension 5 4+  Hip abduction 5 4+  Hip  adduction 5 4+  Knee flexion 5 4  Knee extension 5 4+  Ankle dorsiflexion 5 5  (Blank rows = not tested)   GAIT: Gait pattern: shuffling, decreased trunk rotation, trunk flexed, narrow BOS, poor foot clearance- Right, and poor foot clearance- Left Assistive device utilized: None Level of assistance: SBA Comments: Pt ambulates with significant shuffling at times and has decreased foot clearance which would be indicative of increased risk of falling.  FUNCTIONAL TESTS:  5 times sit to stand: 12.91 with UE support; 15.14 Timed up and go (TUG): 15.94 sec 6 minute walk test: 1231 ft 10 meter walk test: 13.05 sec Functional gait assessment: 17/30  PATIENT SURVEYS:  FOTO 49; Goal: 54   TODAY'S TREATMENT:                                                                                   Ambulated across stable and unstable surface outside. Negotiating changing surfaces from grass to sidewalk, pine needles, and across brick with turns and obstacles in pathway without LOB, x20 minutes; Continued with cuing for larger steps and upright posture.  In // bars: Balloon taps x 5 minutes with spouse for coordination and spatial awareness with reaction timing  Seated marches on physioball for trunk control and balance related tasks Stepping on hedgehogs with close CGA and use of gait belt for stability, x4 laps     PATIENT EDUCATION: Education details: Pt and spouse educated on role of PT and services provided during POC. Person educated: Patient and Spouse Education method: Explanation Education comprehension: verbalized understanding  HOME EXERCISE PROGRAM:  Access Code: [email or web address] URL: [email or web address] Date: 02/07/2022 Prepared by: Tomasa Hose  Exercises - Prone Press Up On Elbows  - 1 x daily - 7 x  weekly - 3 sets - 10 reps - Standing Back Extension  - 1 x daily - 7 x weekly - 3 sets - 10 reps - Seated Gluteal Sets  - 1 x daily - 7 x weekly - 3 sets - 10  reps    GOALS: Goals reviewed with patient? Yes  SHORT TERM GOALS: Target date: 03/16/2022  1.  Pt will be independent with HEP in order to demonstrate increased ability to perform tasks related to occupation/hobbies. Baseline: HEP to be given at next visit Goal status: INITIAL   LONG TERM GOALS: Target date: 05/11/2022  1.  Patient (> 70 years old) will complete five times sit to stand test in < 15 seconds indicating an increased LE strength and improved balance. Baseline: 15.14 with no UE support Goal status: INITIAL  2.  Patient will increase FOTO score to equal to or greater than 54 to demonstrate statistically significant improvement in mobility and quality of life.  Baseline: 49 Goal status: INITIAL   3.  Patient will increase FGA score by > 4 points to demonstrate decreased fall risk during functional activities. Baseline: 17/30 Goal status: INITIAL   4.  Patient will reduce timed up and go to <11 seconds to reduce fall risk and demonstrate improved transfer/gait ability. Baseline: 15.94 sec Goal status: INITIAL  5.  Patient will increase 10 meter walk test to >1.77m/s as to improve gait speed for better community ambulation and to reduce fall risk. Baseline: Not given at IE Goal status: INITIAL  6.  Patient will increase six minute walk test distance to >1500 for progression to community ambulator and improve gait ability Baseline: 1232 ft at Eval Goal status: UPDATED  ASSESSMENT:  CLINICAL IMPRESSION:  Pt continues to demonstrate improved balance, but still lacks safety awareness at times.  Pt tends to ambulate with quicker steps and less stability when getting tired instead of being practical and getting to a seated position safely.  Pt is demonstrating improved balance during tasks and was able to safely navigate ambulation on top of the hedgehogs with good technique and use of close CGA for safety.   Pt will continue to benefit from skilled therapy to address  remaining deficits in order to improve overall QoL and return to PLOF.          OBJECTIVE IMPAIRMENTS: Abnormal gait, decreased activity tolerance, decreased balance, difficulty walking, decreased strength, decreased safety awareness, and pain.   ACTIVITY LIMITATIONS: carrying, lifting, bending, standing, and squatting  PARTICIPATION LIMITATIONS: yard work  PERSONAL FACTORS: Age, Fitness, Past/current experiences, and Time since onset of injury/illness/exacerbation are also affecting patient's functional outcome.   REHAB POTENTIAL: Good  CLINICAL DECISION MAKING: Stable/uncomplicated  EVALUATION COMPLEXITY: Low  PLAN:  PT FREQUENCY: 2x/week  PT DURATION: 12 weeks  PLANNED INTERVENTIONS: Therapeutic exercises, Therapeutic activity, Neuromuscular re-education, Balance training, Gait training, Patient/Family education, Self Care, Joint mobilization, DME instructions, Dry Needling, and Manual therapy   PLAN FOR NEXT SESSION:   Continue with strengthening and postural control.  Assess for HEP compliance and merge two HEP's if still appropriate.   Nolon Bussing, PT, DPT Physical Therapist- [facility]  02/16/22, 3:55 PM

## 2022-02-21 ENCOUNTER — Ambulatory Visit: Payer: Medicare Other

## 2022-02-21 ENCOUNTER — Ambulatory Visit: Payer: Medicare Other | Admitting: Speech Pathology

## 2022-02-21 DIAGNOSIS — R262 Difficulty in walking, not elsewhere classified: Secondary | ICD-10-CM

## 2022-02-21 DIAGNOSIS — R2681 Unsteadiness on feet: Secondary | ICD-10-CM

## 2022-02-21 DIAGNOSIS — M6281 Muscle weakness (generalized): Secondary | ICD-10-CM

## 2022-02-21 NOTE — Therapy (Signed)
OUTPATIENT PHYSICAL THERAPY NEURO TREATMENT   Patient Name: Willie Mcdonald MRN: [id number] DOB:[birth year], 87 y.o., male Today's Date: 02/21/2022   PCP: Marguarite Arbour, MD REFERRING PROVIDER: Marguarite Arbour, MD   PT End of Session - 02/21/22 1559     Visit Number 7    Number of Visits 24    Date for PT Re-Evaluation 04/25/22    Authorization Time Period 01/31/22 - 10/24/94 cert date    Progress Note Due on Visit 10    PT Start Time 1600    PT Stop Time 1645    PT Time Calculation (min) 45 min    Equipment Utilized During Treatment Gait belt    Activity Tolerance Patient tolerated treatment well              Past Medical History:  Diagnosis Date   GERD (gastroesophageal reflux disease)    Hyperlipidemia    Wears hearing aid in both ears    Past Surgical History:  Procedure Laterality Date   CATARACT EXTRACTION W/PHACO Left 05/22/2018   Procedure: CATARACT EXTRACTION PHACO AND INTRAOCULAR LENS PLACEMENT (IOC)  COMPLICATED LEFT TORIC LENS;  Surgeon: Lockie Mola, MD;  Location: [facility];  Service: Ophthalmology;  Laterality: Left;  MALYUGIN   CATARACT EXTRACTION W/PHACO Right 06/12/2018   Procedure: CATARACT EXTRACTION PHACO AND INTRAOCULAR LENS PLACEMENT (IOC)  RIGHT TORIC LENS;  Surgeon: Lockie Mola, MD;  Location: [facility];  Service: Ophthalmology;  Laterality: Right;   TONSILLECTOMY     There are no problems to display for this patient.   ONSET DATE: 2 years  REFERRING DIAG:   R41.3 (ICD-10-CM) - Other amnesia  R26.89 (ICD-10-CM) - Other abnormalities of gait and mobility  R53.1 (ICD-10-CM) - Weakness    THERAPY DIAG:  Difficulty in walking, not elsewhere classified  Muscle weakness (generalized)  Unsteadiness on feet  Rationale for Evaluation and Treatment Rehabilitation  SUBJECTIVE:                                                                                                                                                                                              SUBJECTIVE STATEMENT:  Pt reports he broke his glasses and is awaiting his new pair to arrive.  Pt notes he has been pretty lazy watching TV, but has been doing his HEP.   Pt accompanied by:  Leonette Nutting  PERTINENT HISTORY: Pt's wife reporting that pt has rounded shoulders/stooped posture that has progressively gotten worse over the past few years.  Pt has been diagnosed with [name] dementia as well which is affecting memory and movement patterns according  to the pt and his spouse.  Pt denies needing to come to therapy.  PAIN:  Are you having pain? No; only while sitting still. Worst Pain: 10/10 occasionally, when walking or doing something like pushing the grocery cart Best Pain: 0/10   PRECAUTIONS: None  WEIGHT BEARING RESTRICTIONS: No  FALLS: Has patient fallen in last 6 months? No; 4-5 near misses  LIVING ENVIRONMENT: Lives with: lives with their spouse Lives in: House/apartment Stairs: Yes: Internal: 15 steps; on right going up and External: 2 steps; on right going up First step does not have a rail. Has following equipment at home: Single point cane  PLOF: Independent  PATIENT GOALS: Prevent falls, and strengthen legs while decreasing your pain.  OBJECTIVE:   DIAGNOSTIC FINDINGS:   EXAM: MRI HEAD WITHOUT CONTRAST   IMPRESSION: No acute intracranial process. Overall cerebral atrophy is likely within normal limits for age, with possible slightly more advanced atrophy in the left-greater-than-right temporal lobe.  Pt has an X-ray from a different clinic that is not in current Epic Chart.   COGNITION: Overall cognitive status: Within functional limits for tasks assessed   SENSATION: WFL  COORDINATION: WFL  POSTURE: rounded shoulders, forward head, and flexed trunk   LOWER EXTREMITY ROM:     WFL  LOWER EXTREMITY MMT:    MMT Right Eval Left Eval  Hip flexion 5 4+  Hip extension 5  4+  Hip abduction 5 4+  Hip adduction 5 4+  Knee flexion 5 4  Knee extension 5 4+  Ankle dorsiflexion 5 5  (Blank rows = not tested)   GAIT: Gait pattern: shuffling, decreased trunk rotation, trunk flexed, narrow BOS, poor foot clearance- Right, and poor foot clearance- Left Assistive device utilized: None Level of assistance: SBA Comments: Pt ambulates with significant shuffling at times and has decreased foot clearance which would be indicative of increased risk of falling.  FUNCTIONAL TESTS:  5 times sit to stand: 12.91 with UE support; 15.14 Timed up and go (TUG): 15.94 sec 6 minute walk test: 1231 ft 10 meter walk test: 13.05 sec Functional gait assessment: 17/30  PATIENT SURVEYS:  FOTO 49; Goal: 54   TODAY'S TREATMENT:                                                                                    Seated marches while seated on physioball for trunk control and balance related tasks  Backwards ambulation in hallway, length of hallway x2, pt with noticeable fatigue following performance with increased difficulty keeping balance in standing once completed.  Pt continued to have posterior bias and required a seated rest break before continuing.  Obstacle course including stepping onto hedgehogs, step onto 6" step over down, narrow walking between 2 airex long pads, wobble board rocking forward, and soccer kicks between chair legs, x4 laps    PATIENT EDUCATION: Education details: Pt and spouse educated on role of PT and services provided during POC. Person educated: Patient and Spouse Education method: Explanation Education comprehension: verbalized understanding  HOME EXERCISE PROGRAM:  Access Code: [email or web address] URL: [email or web address] Date: 02/07/2022 Prepared by: Tomasa Hose  Exercises - Prone Press Up On Elbows  - 1  x daily - 7 x weekly - 3 sets - 10 reps - Standing Back Extension  - 1 x daily - 7 x weekly - 3 sets - 10 reps - Seated Gluteal  Sets  - 1 x daily - 7 x weekly - 3 sets - 10 reps    GOALS: Goals reviewed with patient? Yes  SHORT TERM GOALS: Target date: 03/21/2022  1.  Pt will be independent with HEP in order to demonstrate increased ability to perform tasks related to occupation/hobbies. Baseline: HEP to be given at next visit Goal status: INITIAL   LONG TERM GOALS: Target date: 05/16/2022  1.  Patient (> 44 years old) will complete five times sit to stand test in < 15 seconds indicating an increased LE strength and improved balance. Baseline: 15.14 with no UE support Goal status: INITIAL  2.  Patient will increase FOTO score to equal to or greater than 54 to demonstrate statistically significant improvement in mobility and quality of life.  Baseline: 49 Goal status: INITIAL   3.  Patient will increase FGA score by > 4 points to demonstrate decreased fall risk during functional activities. Baseline: 17/30 Goal status: INITIAL   4.  Patient will reduce timed up and go to <11 seconds to reduce fall risk and demonstrate improved transfer/gait ability. Baseline: 15.94 sec Goal status: INITIAL  5.  Patient will increase 10 meter walk test to >1.12m/s as to improve gait speed for better community ambulation and to reduce fall risk. Baseline: Not given at IE Goal status: INITIAL  6.  Patient will increase six minute walk test distance to >1500 for progression to community ambulator and improve gait ability Baseline: 1232 ft at Eval Goal status: UPDATED  ASSESSMENT:  CLINICAL IMPRESSION:  Pt continues to put forth good effort throughout the treatment session and is making progress towards long term goals at this time.  Pt does have some posterior chain weakness when performing backwards ambulation and will likely need to continue to focus on improving posterior muscles during future appointments.   Pt will continue to benefit from skilled therapy to address remaining deficits in order to improve overall QoL  and return to PLOF.           OBJECTIVE IMPAIRMENTS: Abnormal gait, decreased activity tolerance, decreased balance, difficulty walking, decreased strength, decreased safety awareness, and pain.   ACTIVITY LIMITATIONS: carrying, lifting, bending, standing, and squatting  PARTICIPATION LIMITATIONS: yard work  PERSONAL FACTORS: Age, Fitness, Past/current experiences, and Time since onset of injury/illness/exacerbation are also affecting patient's functional outcome.   REHAB POTENTIAL: Good  CLINICAL DECISION MAKING: Stable/uncomplicated  EVALUATION COMPLEXITY: Low  PLAN:  PT FREQUENCY: 2x/week  PT DURATION: 12 weeks  PLANNED INTERVENTIONS: Therapeutic exercises, Therapeutic activity, Neuromuscular re-education, Balance training, Gait training, Patient/Family education, Self Care, Joint mobilization, DME instructions, Dry Needling, and Manual therapy   PLAN FOR NEXT SESSION:   Continue with strengthening and postural control.  Continue with posterior chain musculature.   Nolon Bussing, PT, DPT Physical Therapist- [facility]  02/21/22, 5:09 PM

## 2022-02-23 ENCOUNTER — Ambulatory Visit: Payer: No Typology Code available for payment source

## 2022-02-23 ENCOUNTER — Ambulatory Visit: Payer: Medicare Other

## 2022-02-23 DIAGNOSIS — R41841 Cognitive communication deficit: Secondary | ICD-10-CM

## 2022-02-23 DIAGNOSIS — R262 Difficulty in walking, not elsewhere classified: Secondary | ICD-10-CM

## 2022-02-23 DIAGNOSIS — F0150 Vascular dementia without behavioral disturbance: Secondary | ICD-10-CM

## 2022-02-23 DIAGNOSIS — M6281 Muscle weakness (generalized): Secondary | ICD-10-CM

## 2022-02-23 DIAGNOSIS — F0280 Dementia in other diseases classified elsewhere without behavioral disturbance: Secondary | ICD-10-CM

## 2022-02-23 DIAGNOSIS — R2681 Unsteadiness on feet: Secondary | ICD-10-CM

## 2022-02-23 NOTE — Therapy (Signed)
OUTPATIENT PHYSICAL THERAPY NEURO TREATMENT   Patient Name: Willie Mcdonald MRN: [id number] DOB:1934/12/01, 87 y.o., male Today's Date: 02/23/2022   PCP: Marguarite Arbour, MD REFERRING PROVIDER: Marguarite Arbour, MD   PT End of Session - 02/23/22 1613     Visit Number 8    Number of Visits 24    Date for PT Re-Evaluation 04/25/22    Authorization Time Period 01/31/22 - 6/60/63 cert date    Progress Note Due on Visit 10    PT Start Time 1601    PT Stop Time 1645    PT Time Calculation (min) 44 min    Equipment Utilized During Treatment Gait belt    Activity Tolerance Patient tolerated treatment well               Past Medical History:  Diagnosis Date   GERD (gastroesophageal reflux disease)    Hyperlipidemia    Wears hearing aid in both ears    Past Surgical History:  Procedure Laterality Date   CATARACT EXTRACTION W/PHACO Left 05/22/2018   Procedure: CATARACT EXTRACTION PHACO AND INTRAOCULAR LENS PLACEMENT (IOC)  COMPLICATED LEFT TORIC LENS;  Surgeon: Lockie Mola, MD;  Location: [facility];  Service: Ophthalmology;  Laterality: Left;  MALYUGIN   CATARACT EXTRACTION W/PHACO Right 06/12/2018   Procedure: CATARACT EXTRACTION PHACO AND INTRAOCULAR LENS PLACEMENT (IOC)  RIGHT TORIC LENS;  Surgeon: Lockie Mola, MD;  Location: [facility];  Service: Ophthalmology;  Laterality: Right;   TONSILLECTOMY     There are no problems to display for this patient.   ONSET DATE: 2 years  REFERRING DIAG:   R41.3 (ICD-10-CM) - Other amnesia  R26.89 (ICD-10-CM) - Other abnormalities of gait and mobility  R53.1 (ICD-10-CM) - Weakness    THERAPY DIAG:  Difficulty in walking, not elsewhere classified  Muscle weakness (generalized)  Unsteadiness on feet  Cognitive communication deficit  Mixed dementia (HCC)  Rationale for Evaluation and Treatment Rehabilitation  SUBJECTIVE:                                                                                                                                                                                              SUBJECTIVE STATEMENT:  Pt reports he broke his glasses and is awaiting his new pair to arrive.  Pt notes he has been pretty lazy watching TV, but has been doing his HEP.   Pt accompanied by:  Leonette Nutting  PERTINENT HISTORY: Pt's wife reporting that pt has rounded shoulders/stooped posture that has progressively gotten worse over the past few years.  Pt has been diagnosed with [name] dementia as  well which is affecting memory and movement patterns according to the pt and his spouse.  Pt denies needing to come to therapy.  PAIN:  Are you having pain? No; only while sitting still. Worst Pain: 10/10 occasionally, when walking or doing something like pushing the grocery cart Best Pain: 0/10   PRECAUTIONS: None  WEIGHT BEARING RESTRICTIONS: No  FALLS: Has patient fallen in last 6 months? No; 4-5 near misses  LIVING ENVIRONMENT: Lives with: lives with their spouse Lives in: House/apartment Stairs: Yes: Internal: 15 steps; on right going up and External: 2 steps; on right going up First step does not have a rail. Has following equipment at home: Single point cane  PLOF: Independent  PATIENT GOALS: Prevent falls, and strengthen legs while decreasing your pain.  OBJECTIVE:   DIAGNOSTIC FINDINGS:   EXAM: MRI HEAD WITHOUT CONTRAST   IMPRESSION: No acute intracranial process. Overall cerebral atrophy is likely within normal limits for age, with possible slightly more advanced atrophy in the left-greater-than-right temporal lobe.  Pt has an X-ray from a different clinic that is not in current Epic Chart.   COGNITION: Overall cognitive status: Within functional limits for tasks assessed   SENSATION: WFL  COORDINATION: WFL  POSTURE: rounded shoulders, forward head, and flexed trunk   LOWER EXTREMITY ROM:     WFL  LOWER EXTREMITY MMT:    MMT  Right Eval Left Eval  Hip flexion 5 4+  Hip extension 5 4+  Hip abduction 5 4+  Hip adduction 5 4+  Knee flexion 5 4  Knee extension 5 4+  Ankle dorsiflexion 5 5  (Blank rows = not tested)   GAIT: Gait pattern: shuffling, decreased trunk rotation, trunk flexed, narrow BOS, poor foot clearance- Right, and poor foot clearance- Left Assistive device utilized: None Level of assistance: SBA Comments: Pt ambulates with significant shuffling at times and has decreased foot clearance which would be indicative of increased risk of falling.  FUNCTIONAL TESTS:  5 times sit to stand: 12.91 with UE support; 15.14 Timed up and go (TUG): 15.94 sec 6 minute walk test: 1231 ft 10 meter walk test: 13.05 sec Functional gait assessment: 17/30  PATIENT SURVEYS:  FOTO 49; Goal: 54   TODAY'S TREATMENT:                                                                                  THEREX: Focus on Posterior chain LE strengthening:  Seated ham curls with cable 2.5# 2 sets of 12 reps each LE Standing heel to toe gait with cable 2.5 # 2 sets of 12 reps each LE  Standing hip ext with 7.5# - 2 sets of 12 reps each LE (Patient holding onto handles of SW)- Increased VC for erect posture Backwards ambulation in // bars with 3# BLE- down and back initially requiring 12 steps to complete- multiple trials down to 7 feet at best Side step over 1/2 foam X 4 in // bars- without UE support using 3#- initial difficulty with VC for wider steps  Forward steps over 1/2 foam x 4 in // bars without UE support with 3#- VC for erect posture-       PATIENT  EDUCATION: Education details: Pt and spouse educated on role of PT and services provided during POC. Person educated: Patient and Spouse Education method: Explanation Education comprehension: verbalized understanding  HOME EXERCISE PROGRAM:  Access Code: [email or web address] URL: [email or web address] Date: 02/07/2022 Prepared by: Tomasa Hose  Exercises - Prone Press Up On Elbows  - 1 x daily - 7 x weekly - 3 sets - 10 reps - Standing Back Extension  - 1 x daily - 7 x weekly - 3 sets - 10 reps - Seated Gluteal Sets  - 1 x daily - 7 x weekly - 3 sets - 10 reps    GOALS: Goals reviewed with patient? Yes  SHORT TERM GOALS: Target date: 03/23/2022  1.  Pt will be independent with HEP in order to demonstrate increased ability to perform tasks related to occupation/hobbies. Baseline: HEP to be given at next visit Goal status: INITIAL   LONG TERM GOALS: Target date: 05/18/2022  1.  Patient (> 22 years old) will complete five times sit to stand test in < 15 seconds indicating an increased LE strength and improved balance. Baseline: 15.14 with no UE support Goal status: INITIAL  2.  Patient will increase FOTO score to equal to or greater than 54 to demonstrate statistically significant improvement in mobility and quality of life.  Baseline: 49 Goal status: INITIAL   3.  Patient will increase FGA score by > 4 points to demonstrate decreased fall risk during functional activities. Baseline: 17/30 Goal status: INITIAL   4.  Patient will reduce timed up and go to <11 seconds to reduce fall risk and demonstrate improved transfer/gait ability. Baseline: 15.94 sec Goal status: INITIAL  5.  Patient will increase 10 meter walk test to >1.58m/s as to improve gait speed for better community ambulation and to reduce fall risk. Baseline: Not given at IE Goal status: INITIAL  6.  Patient will increase six minute walk test distance to >1500 for progression to community ambulator and improve gait ability Baseline: 1232 ft at Eval Goal status: UPDATED  ASSESSMENT:  CLINICAL IMPRESSION:  Patient presents with good motivation- Joking but compliant with all exercises. He benefited from posterior chain strengthening with obvious weakness and will benefit from continued training. He required constant VC for posture during standing  activities but no significant LOB during session.  Pt will continue to benefit from skilled therapy to address remaining deficits in order to improve overall QoL and return to PLOF.           OBJECTIVE IMPAIRMENTS: Abnormal gait, decreased activity tolerance, decreased balance, difficulty walking, decreased strength, decreased safety awareness, and pain.   ACTIVITY LIMITATIONS: carrying, lifting, bending, standing, and squatting  PARTICIPATION LIMITATIONS: yard work  PERSONAL FACTORS: Age, Fitness, Past/current experiences, and Time since onset of injury/illness/exacerbation are also affecting patient's functional outcome.   REHAB POTENTIAL: Good  CLINICAL DECISION MAKING: Stable/uncomplicated  EVALUATION COMPLEXITY: Low  PLAN:  PT FREQUENCY: 2x/week  PT DURATION: 12 weeks  PLANNED INTERVENTIONS: Therapeutic exercises, Therapeutic activity, Neuromuscular re-education, Balance training, Gait training, Patient/Family education, Self Care, Joint mobilization, DME instructions, Dry Needling, and Manual therapy   PLAN FOR NEXT SESSION:   Continue with strengthening and postural control.  Continue with posterior chain musculature.   Louis Meckel, PT Physical Therapist- [facility]  02/23/22, 5:02 PM

## 2022-02-28 ENCOUNTER — Ambulatory Visit: Payer: Medicare Other

## 2022-02-28 ENCOUNTER — Encounter: Payer: No Typology Code available for payment source | Admitting: Speech Pathology

## 2022-02-28 DIAGNOSIS — R2681 Unsteadiness on feet: Secondary | ICD-10-CM

## 2022-02-28 DIAGNOSIS — R262 Difficulty in walking, not elsewhere classified: Secondary | ICD-10-CM | POA: Diagnosis not present

## 2022-02-28 DIAGNOSIS — M6281 Muscle weakness (generalized): Secondary | ICD-10-CM

## 2022-02-28 NOTE — Therapy (Signed)
OUTPATIENT PHYSICAL THERAPY NEURO TREATMENT   Patient Name: Willie Mcdonald MRN: [id number] DOB:[birth year], 87 y.o., male Today's Date: 02/28/2022   PCP: Marguarite Arbour, MD REFERRING PROVIDER: Marguarite Arbour, MD   PT End of Session - 02/28/22 1256     Visit Number 9    Number of Visits 24    Date for PT Re-Evaluation 04/25/22    Authorization Time Period 01/31/22 - 2/72/53 cert date    Progress Note Due on Visit 10    PT Start Time 1302    PT Stop Time 1344    PT Time Calculation (min) 42 min    Equipment Utilized During Treatment Gait belt    Activity Tolerance Patient tolerated treatment well    Behavior During Therapy WFL for tasks assessed/performed                Past Medical History:  Diagnosis Date   GERD (gastroesophageal reflux disease)    Hyperlipidemia    Wears hearing aid in both ears    Past Surgical History:  Procedure Laterality Date   CATARACT EXTRACTION W/PHACO Left 05/22/2018   Procedure: CATARACT EXTRACTION PHACO AND INTRAOCULAR LENS PLACEMENT (IOC)  COMPLICATED LEFT TORIC LENS;  Surgeon: Lockie Mola, MD;  Location: [facility];  Service: Ophthalmology;  Laterality: Left;  MALYUGIN   CATARACT EXTRACTION W/PHACO Right 06/12/2018   Procedure: CATARACT EXTRACTION PHACO AND INTRAOCULAR LENS PLACEMENT (IOC)  RIGHT TORIC LENS;  Surgeon: Lockie Mola, MD;  Location: [facility];  Service: Ophthalmology;  Laterality: Right;   TONSILLECTOMY     There are no problems to display for this patient.   ONSET DATE: 2 years  REFERRING DIAG:   R41.3 (ICD-10-CM) - Other amnesia  R26.89 (ICD-10-CM) - Other abnormalities of gait and mobility  R53.1 (ICD-10-CM) - Weakness    THERAPY DIAG:  Muscle weakness (generalized)  Unsteadiness on feet  Rationale for Evaluation and Treatment Rehabilitation  SUBJECTIVE:                                                                                                                                                                                              SUBJECTIVE STATEMENT: Pt reports no pain at the moment. Still having some pain in back, BLEs at times. Pt reports no stumble/falls. Pt has not been doing his HEP but has been "doing a lot of peddling" in seated.   Pt accompanied by:  Leonette Nutting  PERTINENT HISTORY: Pt's wife reporting that pt has rounded shoulders/stooped posture that has progressively gotten worse over the past few years.  Pt has been diagnosed with [name]  dementia as well which is affecting memory and movement patterns according to the pt and his spouse.  Pt denies needing to come to therapy.  PAIN:  Are you having pain? No; only while sitting still. Worst Pain: 10/10 occasionally, when walking or doing something like pushing the grocery cart Best Pain: 0/10   PRECAUTIONS: None  WEIGHT BEARING RESTRICTIONS: No  FALLS: Has patient fallen in last 6 months? No; 4-5 near misses  LIVING ENVIRONMENT: Lives with: lives with their spouse Lives in: House/apartment Stairs: Yes: Internal: 15 steps; on right going up and External: 2 steps; on right going up First step does not have a rail. Has following equipment at home: Single point cane  PLOF: Independent  PATIENT GOALS: Prevent falls, and strengthen legs while decreasing your pain.  OBJECTIVE:   DIAGNOSTIC FINDINGS:   EXAM: MRI HEAD WITHOUT CONTRAST   IMPRESSION: No acute intracranial process. Overall cerebral atrophy is likely within normal limits for age, with possible slightly more advanced atrophy in the left-greater-than-right temporal lobe.  Pt has an X-ray from a different clinic that is not in current Epic Chart.   COGNITION: Overall cognitive status: Within functional limits for tasks assessed   SENSATION: WFL  COORDINATION: WFL  POSTURE: rounded shoulders, forward head, and flexed trunk   LOWER EXTREMITY ROM:     WFL  LOWER EXTREMITY MMT:    MMT  Right Eval Left Eval  Hip flexion 5 4+  Hip extension 5 4+  Hip abduction 5 4+  Hip adduction 5 4+  Knee flexion 5 4  Knee extension 5 4+  Ankle dorsiflexion 5 5  (Blank rows = not tested)   GAIT: Gait pattern: shuffling, decreased trunk rotation, trunk flexed, narrow BOS, poor foot clearance- Right, and poor foot clearance- Left Assistive device utilized: None Level of assistance: SBA Comments: Pt ambulates with significant shuffling at times and has decreased foot clearance which would be indicative of increased risk of falling.  FUNCTIONAL TESTS:  5 times sit to stand: 12.91 with UE support; 15.14 Timed up and go (TUG): 15.94 sec 6 minute walk test: 1231 ft 10 meter walk test: 13.05 sec Functional gait assessment: 17/30  PATIENT SURVEYS:  FOTO 49; Goal: 54   TODAY'S TREATMENT:                                                                                  THEREX: Focus on Posterior chain LE strengthening:  standing ham curls with 3#  AW 2 sets of 10 reps each LE. Rates hard.   Standing marches with 3# AW 2x10 each LE  Standing heel to toe gait with cable machine 2.5 # 12x FWD/BCKWD reps  Seated thoracic ext over chair 2x10   Standing hip ext with 3#  AW-2x12 each LE frequent VC for erect posture  Standing scapular squeezes 10x  Seated scapular squeezes 10x  Backwards ambulation in // bars with 3# AW BLE 10x length of bars  Side step over 1/2 foam X 12 in // bars- using 3#AW - cuing for increased step-length  Forward steps over 1/2 foam x 12 in // bars without UE support with 3# AW- cuing for increased step-length -  Pt then repeated the above sequence with an orange hurdle for side stepping and FWD/BCKWD    PATIENT EDUCATION: Education details: Pt educated throughout session about proper posture and technique with exercises. Improved exercise technique, movement at target joints, use of target muscles after min to mod verbal, visual, tactile cues.  Person  educated: Patient and Spouse Education method: Explanation Education comprehension: verbalized understanding  HOME EXERCISE PROGRAM:  Access Code: [email or web address] URL: [email or web address] Date: 02/07/2022 Prepared by: Tomasa Hose  Exercises - Prone Press Up On Elbows  - 1 x daily - 7 x weekly - 3 sets - 10 reps - Standing Back Extension  - 1 x daily - 7 x weekly - 3 sets - 10 reps - Seated Gluteal Sets  - 1 x daily - 7 x weekly - 3 sets - 10 reps    GOALS: Goals reviewed with patient? Yes  SHORT TERM GOALS: Target date: 03/28/2022  1.  Pt will be independent with HEP in order to demonstrate increased ability to perform tasks related to occupation/hobbies. Baseline: HEP to be given at next visit Goal status: INITIAL   LONG TERM GOALS: Target date: 05/23/2022  1.  Patient (> 76 years old) will complete five times sit to stand test in < 15 seconds indicating an increased LE strength and improved balance. Baseline: 15.14 with no UE support Goal status: INITIAL  2.  Patient will increase FOTO score to equal to or greater than 54 to demonstrate statistically significant improvement in mobility and quality of life.  Baseline: 49 Goal status: INITIAL   3.  Patient will increase FGA score by > 4 points to demonstrate decreased fall risk during functional activities. Baseline: 17/30 Goal status: INITIAL   4.  Patient will reduce timed up and go to <11 seconds to reduce fall risk and demonstrate improved transfer/gait ability. Baseline: 15.94 sec Goal status: INITIAL  5.  Patient will increase 10 meter walk test to >1.28m/s as to improve gait speed for better community ambulation and to reduce fall risk. Baseline: Not given at IE Goal status: INITIAL  6.  Patient will increase six minute walk test distance to >1500 for progression to community ambulator and improve gait ability Baseline: 1232 ft at Eval Goal status: UPDATED  ASSESSMENT:  CLINICAL IMPRESSION: Pt  able to progress interventions with either level of resistance used or volume of reps completed. Pt not reliable in reporting perceived difficulty level, but did not exhibit signs of excessive fatigue with interventions. Continued to provide frequent cuing throughout for upright posture. Pt able to intermittently correct but difficulty sustaining this. Pt will continue to benefit from skilled therapy to address remaining deficits in order to improve overall QoL and return to PLOF.       OBJECTIVE IMPAIRMENTS: Abnormal gait, decreased activity tolerance, decreased balance, difficulty walking, decreased strength, decreased safety awareness, and pain.   ACTIVITY LIMITATIONS: carrying, lifting, bending, standing, and squatting  PARTICIPATION LIMITATIONS: yard work  PERSONAL FACTORS: Age, Fitness, Past/current experiences, and Time since onset of injury/illness/exacerbation are also affecting patient's functional outcome.   REHAB POTENTIAL: Good  CLINICAL DECISION MAKING: Stable/uncomplicated  EVALUATION COMPLEXITY: Low  PLAN:  PT FREQUENCY: 2x/week  PT DURATION: 12 weeks  PLANNED INTERVENTIONS: Therapeutic exercises, Therapeutic activity, Neuromuscular re-education, Balance training, Gait training, Patient/Family education, Self Care, Joint mobilization, DME instructions, Dry Needling, and Manual therapy   PLAN FOR NEXT SESSION:   Continue with strengthening and postural control.  Continue with posterior chain musculature. Continue plan  Temple Pacini PT, DPT  Physical Therapist- [facility]  02/28/22, 4:49 PM

## 2022-03-07 ENCOUNTER — Encounter: Payer: No Typology Code available for payment source | Admitting: Speech Pathology

## 2022-03-07 ENCOUNTER — Ambulatory Visit: Payer: Medicare Other

## 2022-03-07 DIAGNOSIS — F0280 Dementia in other diseases classified elsewhere without behavioral disturbance: Secondary | ICD-10-CM

## 2022-03-07 DIAGNOSIS — R2681 Unsteadiness on feet: Secondary | ICD-10-CM

## 2022-03-07 DIAGNOSIS — R262 Difficulty in walking, not elsewhere classified: Secondary | ICD-10-CM | POA: Diagnosis not present

## 2022-03-07 DIAGNOSIS — M6281 Muscle weakness (generalized): Secondary | ICD-10-CM

## 2022-03-07 DIAGNOSIS — R41841 Cognitive communication deficit: Secondary | ICD-10-CM

## 2022-03-07 NOTE — Therapy (Signed)
OUTPATIENT PHYSICAL THERAPY NEURO TREATMENT/PHYSICAL THERAPY PROGRESS NOTE/DISCHARGE   Dates of reporting period  01/31/22   to   03/07/22    Patient Name: Willie Mcdonald MRN: [id number] DOB:[birth year], 87 y.o., male Today's Date: 03/08/2022   PCP: Idelle Crouch, MD REFERRING PROVIDER: Idelle Crouch, MD   PT End of Session - 03/07/22 1145     Visit Number 10    Number of Visits 24    Date for PT Re-Evaluation 04/25/22    Authorization Time Period 56/43/32 - 9/51/88 cert date    Progress Note Due on Visit 10    PT Start Time 1145    PT Stop Time 1230    PT Time Calculation (min) 45 min    Equipment Utilized During Treatment Gait belt    Activity Tolerance Patient tolerated treatment well    Behavior During Therapy WFL for tasks assessed/performed                 Past Medical History:  Diagnosis Date   GERD (gastroesophageal reflux disease)    Hyperlipidemia    Wears hearing aid in both ears    Past Surgical History:  Procedure Laterality Date   CATARACT EXTRACTION W/PHACO Left 05/22/2018   Procedure: CATARACT EXTRACTION PHACO AND INTRAOCULAR LENS PLACEMENT ([place])  COMPLICATED LEFT TORIC LENS;  Surgeon: Leandrew Koyanagi, MD;  Location: [place];  Service: Ophthalmology;  Laterality: Left;  MALYUGIN   CATARACT EXTRACTION W/PHACO Right 06/12/2018   Procedure: CATARACT EXTRACTION PHACO AND INTRAOCULAR LENS PLACEMENT ([place])  RIGHT TORIC LENS;  Surgeon: Leandrew Koyanagi, MD;  Location: [place];  Service: Ophthalmology;  Laterality: Right;   TONSILLECTOMY     There are no problems to display for this patient.   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    Pt accompanied by:  Harlin Rain  PERTINENT HISTORY: Pt's wife reporting that pt has  rounded shoulders/stooped posture that has progressively gotten worse over the past few years.  Pt has been diagnosed with [name] dementia as well which is affecting memory and movement patterns according to the pt and his spouse.  Pt denies needing to come to therapy.  PAIN:  Are you having pain? No; only while sitting still. Worst Pain: 10/10 occasionally, when walking or doing something like pushing the grocery cart Best Pain: 0/10   PRECAUTIONS: None  WEIGHT BEARING RESTRICTIONS: No  FALLS: Has patient fallen in last 6 months? No; 4-5 near misses  LIVING ENVIRONMENT: Lives with: lives with their spouse Lives in: House/apartment Stairs: Yes: Internal: 15 steps; on right going up and External: 2 steps; on right going up First step does not have a rail. Has following equipment at home: Single point cane  PLOF: Independent  PATIENT GOALS: Prevent falls, and strengthen legs while decreasing your pain.  OBJECTIVE:   DIAGNOSTIC FINDINGS:   EXAM: MRI HEAD WITHOUT CONTRAST   IMPRESSION: No acute intracranial process. Overall cerebral atrophy is likely within normal limits for age, with possible slightly more advanced atrophy in the left-greater-than-right temporal lobe.  Pt has an X-ray from a different clinic that is not in current Epic Chart.   COGNITION: Overall cognitive status: Within functional limits for tasks  assessed   SENSATION: WFL  COORDINATION: WFL  POSTURE: rounded shoulders, forward head, and flexed trunk   LOWER EXTREMITY ROM:     WFL  LOWER EXTREMITY MMT:    MMT Right Eval Left Eval  Hip flexion 5 4+  Hip extension 5 4+  Hip abduction 5 4+  Hip adduction 5 4+  Knee flexion 5 4  Knee extension 5 4+  Ankle dorsiflexion 5 5  (Blank rows = not tested)   GAIT: Gait pattern: shuffling, decreased trunk rotation, trunk flexed, narrow BOS, poor foot clearance- Right, and poor foot clearance- Left Assistive device utilized: None Level of assistance: SBA Comments: Pt ambulates with significant shuffling at times and has decreased foot clearance which would be indicative of increased risk of falling.  FUNCTIONAL TESTS:  5 times sit to stand: 12.91 with UE support; 15.14 Timed up and go (TUG): 15.94 sec 6 minute walk test: 1231 ft 10 meter walk test: 13.05 sec Functional gait assessment: 17/30  PATIENT SURVEYS:  FOTO 49; Goal: 54   TODAY'S TREATMENT:  Goal assessment performed and noted below as part of discharge.   PATIENT EDUCATION: Education details: Pt educated throughout session about proper posture and technique with exercises. Improved exercise technique, movement at target joints, use of target muscles after min to mod verbal, visual, tactile cues.  Person educated: Patient and Spouse Education method: Explanation Education comprehension: verbalized understanding  HOME EXERCISE PROGRAM:  Access Code: [email or web address] URL: [email or web address] Date: 02/07/2022 Prepared by: [place]  Exercises - Prone Press Up On Elbows  - 1 x daily - 7 x weekly - 3 sets - 10 reps - Standing Back Extension  - 1 x daily - 7 x weekly - 3 sets - 10 reps - Seated Gluteal Sets  - 1 x daily - 7 x weekly - 3 sets - 10 reps    GOALS: Goals reviewed with patient? Yes  SHORT TERM GOALS: Target date: 04/05/2022  1.  Pt will be independent with HEP in order to  demonstrate increased ability to perform tasks related to occupation/hobbies. Baseline: HEP to be given at next visit Goal status: INITIAL   LONG TERM GOALS: Target date: 05/31/2022  1.  Patient (> 60 years old) will complete five times sit to stand test in < 15 seconds indicating an increased LE strength and improved balance. Baseline: 15.14 with no UE support 03/07/22: 12.58 sec with no UE support Goal status: MET  2.  Patient will increase FOTO score to equal to or greater than 54 to demonstrate statistically significant improvement in mobility and quality of life.  Baseline: 49 03/07/22: 53 Goal status: PROGRESSING   3.  Patient will increase FGA score by > 4 points to demonstrate decreased fall risk during functional activities. Baseline: 17/30 03/07/22: 24/30 Goal status: MET   4.  Patient will reduce timed up and go to <11 seconds to reduce fall risk and demonstrate improved transfer/gait ability. Baseline: 15.94 sec 03/07/22: 9.93 sec Goal status: MET  5.  Patient will increase 10 meter walk test to >1.0m/s as to improve gait speed for better community ambulation and to reduce fall risk. Baseline: Not given at IE 03/07/22: 1.34 m/s Goal status: MET  6.  Patient will increase six minute walk test distance to >1500 for progression to community ambulator and improve gait ability Baseline: 1232 ft at Eval 03/07/22:  Goal status: NOT PERFORMED   ASSESSMENT:  CLINICAL IMPRESSION:  Pt has made significant progress towards all goals as noted above.  Pt has met 4/5 goals assessed and the final 6MWT was not assessed due to time constraints.  Pt and spouse both happy with the progress that he has made thus far in therapy.  Pt continues to perform well with all the tasks given and was given HEP to continue to perform at home as part of return to PLOF.  Pt encouraged to exercise more and be more active with spouse on shopping trips, etc., in order to improve mobility.  Pt is d/c  at this time and advised to contact the clinic if any needs arise.       OBJECTIVE IMPAIRMENTS: Abnormal gait, decreased activity tolerance, decreased balance, difficulty walking, decreased strength, decreased safety awareness, and pain.   ACTIVITY LIMITATIONS: carrying, lifting, bending, standing, and squatting  PARTICIPATION LIMITATIONS: yard work  PERSONAL FACTORS: Age, Fitness, Past/current experiences, and Time since onset of injury/illness/exacerbation are also affecting patient's functional outcome.   REHAB POTENTIAL: Good  CLINICAL DECISION MAKING: Stable/uncomplicated  EVALUATION COMPLEXITY: Low  PLAN:  PT FREQUENCY: 2x/week  PT DURATION: 12 weeks  PLANNED INTERVENTIONS: Therapeutic exercises, Therapeutic activity, Neuromuscular re-education, Balance training, Gait training, Patient/Family education, Self Care, Joint mobilization, DME instructions, Dry Needling, and Manual therapy   PLAN FOR NEXT SESSION:   Pt is d/c at this time.   , PT, DPT Physical Therapist- [place]  [facility]  03/08/22, 5:00 PM      

## 2022-03-09 ENCOUNTER — Ambulatory Visit: Payer: Medicare Other

## 2022-03-14 ENCOUNTER — Ambulatory Visit: Payer: No Typology Code available for payment source

## 2022-03-14 ENCOUNTER — Encounter: Payer: No Typology Code available for payment source | Admitting: Speech Pathology

## 2022-03-16 ENCOUNTER — Encounter: Payer: No Typology Code available for payment source | Admitting: Speech Pathology

## 2022-03-21 ENCOUNTER — Encounter: Payer: No Typology Code available for payment source | Admitting: Speech Pathology

## 2022-03-21 ENCOUNTER — Ambulatory Visit: Payer: Medicare Other

## 2022-03-23 ENCOUNTER — Ambulatory Visit: Payer: Medicare Other

## 2022-03-28 ENCOUNTER — Encounter: Payer: No Typology Code available for payment source | Admitting: Speech Pathology

## 2022-03-28 ENCOUNTER — Ambulatory Visit: Payer: Medicare Other

## 2022-03-30 ENCOUNTER — Encounter: Payer: No Typology Code available for payment source | Admitting: Speech Pathology

## 2022-03-30 ENCOUNTER — Ambulatory Visit: Payer: No Typology Code available for payment source

## 2022-04-04 ENCOUNTER — Ambulatory Visit: Payer: No Typology Code available for payment source

## 2022-04-06 ENCOUNTER — Encounter: Payer: No Typology Code available for payment source | Admitting: Speech Pathology

## 2022-04-06 ENCOUNTER — Ambulatory Visit: Payer: No Typology Code available for payment source

## 2022-04-11 ENCOUNTER — Ambulatory Visit: Payer: No Typology Code available for payment source

## 2022-04-12 ENCOUNTER — Ambulatory Visit: Payer: No Typology Code available for payment source

## 2022-04-12 ENCOUNTER — Encounter: Payer: No Typology Code available for payment source | Admitting: Speech Pathology

## 2022-04-13 ENCOUNTER — Ambulatory Visit: Payer: No Typology Code available for payment source

## 2022-04-14 ENCOUNTER — Ambulatory Visit: Payer: No Typology Code available for payment source

## 2022-04-17 ENCOUNTER — Ambulatory Visit: Payer: No Typology Code available for payment source

## 2022-04-17 ENCOUNTER — Encounter: Payer: No Typology Code available for payment source | Admitting: Speech Pathology

## 2022-04-18 ENCOUNTER — Ambulatory Visit: Payer: No Typology Code available for payment source

## 2022-04-19 ENCOUNTER — Encounter: Payer: No Typology Code available for payment source | Admitting: Speech Pathology

## 2022-04-19 ENCOUNTER — Ambulatory Visit: Payer: No Typology Code available for payment source

## 2022-04-20 ENCOUNTER — Ambulatory Visit: Payer: No Typology Code available for payment source

## 2022-04-25 ENCOUNTER — Encounter: Payer: No Typology Code available for payment source | Admitting: Speech Pathology

## 2022-04-25 ENCOUNTER — Ambulatory Visit: Payer: No Typology Code available for payment source

## 2022-04-27 ENCOUNTER — Ambulatory Visit: Payer: No Typology Code available for payment source

## 2022-05-01 ENCOUNTER — Other Ambulatory Visit: Payer: Self-pay | Admitting: Internal Medicine

## 2022-05-01 DIAGNOSIS — R55 Syncope and collapse: Secondary | ICD-10-CM

## 2022-05-02 ENCOUNTER — Ambulatory Visit: Payer: No Typology Code available for payment source

## 2022-05-02 ENCOUNTER — Encounter: Payer: No Typology Code available for payment source | Admitting: Speech Pathology

## 2022-05-04 ENCOUNTER — Ambulatory Visit: Payer: No Typology Code available for payment source

## 2022-05-09 ENCOUNTER — Ambulatory Visit: Payer: No Typology Code available for payment source

## 2022-05-09 ENCOUNTER — Encounter: Payer: No Typology Code available for payment source | Admitting: Speech Pathology

## 2022-05-11 ENCOUNTER — Ambulatory Visit: Payer: No Typology Code available for payment source

## 2022-05-12 ENCOUNTER — Ambulatory Visit
Admission: RE | Admit: 2022-05-12 | Discharge: 2022-05-12 | Disposition: A | Discharge status: Home / Self Care | Payer: Medicare Other | Source: Ambulatory Visit | Attending: Internal Medicine | Admitting: Internal Medicine

## 2022-05-12 DIAGNOSIS — R55 Syncope and collapse: Secondary | ICD-10-CM | POA: Diagnosis not present

## 2022-05-16 ENCOUNTER — Encounter: Payer: No Typology Code available for payment source | Admitting: Speech Pathology

## 2022-05-16 ENCOUNTER — Ambulatory Visit: Payer: No Typology Code available for payment source

## 2022-05-18 ENCOUNTER — Ambulatory Visit: Payer: No Typology Code available for payment source | Admitting: Physical Therapy

## 2022-05-18 ENCOUNTER — Encounter: Payer: No Typology Code available for payment source | Admitting: Speech Pathology

## 2022-05-19 ENCOUNTER — Emergency Department: Payer: Medicare Other

## 2022-05-19 ENCOUNTER — Emergency Department
Admission: EM | Admit: 2022-05-19 | Discharge: 2022-05-19 | Disposition: A | Discharge status: Home / Self Care | Payer: Medicare Other | Attending: Student in an Organized Health Care Education/Training Program | Admitting: Student in an Organized Health Care Education/Training Program

## 2022-05-19 ENCOUNTER — Other Ambulatory Visit: Payer: Self-pay

## 2022-05-19 DIAGNOSIS — R55 Syncope and collapse: Secondary | ICD-10-CM

## 2022-05-19 LAB — BASIC METABOLIC PANEL
Anion gap: 7 (ref 5–15)
BUN: 19 mg/dL (ref 8–23)
CO2: 26 mmol/L (ref 22–32)
Calcium: 8.4 mg/dL — ABNORMAL LOW (ref 8.9–10.3)
Chloride: 104 mmol/L (ref 98–111)
Creatinine, Ser: 1 mg/dL (ref 0.61–1.24)
GFR, Estimated: >60 mL/min (ref >60)
Glucose, Bld: 126 mg/dL — ABNORMAL HIGH (ref 70–99)
Potassium: 4.2 mmol/L (ref 3.5–5.1)
Sodium: 137 mmol/L (ref 135–145)

## 2022-05-19 LAB — URINALYSIS, ROUTINE W REFLEX MICROSCOPIC
Bilirubin Urine: NEGATIVE
Glucose, UA: NEGATIVE mg/dL
Hgb urine dipstick: NEGATIVE
Ketones, ur: NEGATIVE mg/dL
Leukocytes,Ua: NEGATIVE
Nitrite: NEGATIVE
Protein, ur: NEGATIVE mg/dL
Specific Gravity, Urine: 1.01 (ref 1.005–1.030)
pH: 7 (ref 5.0–8.0)

## 2022-05-19 LAB — CBC
HCT: 34.4 % — ABNORMAL LOW (ref 39.0–52.0)
Hemoglobin: 11.3 g/dL — ABNORMAL LOW (ref 13.0–17.0)
MCH: 31.6 pg (ref 26.0–34.0)
MCHC: 32.8 g/dL (ref 30.0–36.0)
MCV: 96.1 fL (ref 80.0–100.0)
Platelets: 269 10*3/uL (ref 150–400)
RBC: 3.58 MIL/uL — ABNORMAL LOW (ref 4.22–5.81)
RDW: 12.9 % (ref 11.5–15.5)
WBC: 9.2 10*3/uL (ref 4.0–10.5)
nRBC: 0 % (ref 0.0–0.2)

## 2022-05-19 LAB — TROPONIN I (HIGH SENSITIVITY)
Troponin I (High Sensitivity): 3 ng/L (ref <18)
Troponin I (High Sensitivity): 3 ng/L (ref <18)

## 2022-05-19 LAB — CBG MONITORING, ED: Glucose-Capillary: 110 mg/dL — ABNORMAL HIGH (ref 70–99)

## 2022-05-19 MED ORDER — SODIUM CHLORIDE 0.9 % IV BOLUS
1000.0000 mL | Freq: Once | INTRAVENOUS | Status: AC
  Administered 2022-05-19: 1000 mL via INTRAVENOUS

## 2022-05-19 NOTE — ED Provider Notes (Signed)
[facility] Provider Note    Event Date/Time   First MD Initiated Contact with Patient 05/19/22 [phone]     (approximate)   History   Near Syncope   HPI  Willie Mcdonald is a 88 y.o. male presents to the ER for evaluation of fainting spell occurred while seated today.  Has had 1 episode of this happened few weeks ago had outpatient workup that was unrevealing.  Family reports that they suspect that he is dehydrated as they have trouble encouraging him to drink fluid.  Patient denies any pain.  There is no seizure-like activity.  Denies any shortness of breath no nausea or vomiting.  Was recently decreased on his blood pressure medication.     Physical Exam   Triage Vital Signs: ED Triage Vitals  Enc Vitals Group     BP 05/19/22 0955 (!) 141/59     Pulse Rate 05/19/22 0955 (!) 59     Resp 05/19/22 0955 15     Temp 05/19/22 0955 98.2 F (36.8 C)     Temp Source 05/19/22 0955 Oral     SpO2 05/19/22 0955 100 %     Weight 05/19/22 0950 144 lb 10 oz (65.6 kg)     Height --      Head Circumference --      Peak Flow --      Pain Score 05/19/22 0950 0     Pain Loc --      Pain Edu? --      Excl. in [place]? --     Most recent vital signs: Vitals:   05/19/22 1200 05/19/22 1230  BP: 122/83 122/88  Pulse: 63 (!) 59  Resp: 18 18  Temp:    SpO2: 100% 100%     Constitutional: Alert  Eyes: Conjunctivae are normal.  Head: Atraumatic. Nose: No congestion/rhinnorhea. Mouth/Throat: Mucous membranes are moist.   Neck: Painless ROM.  Cardiovascular:   Good peripheral circulation. Respiratory: Normal respiratory effort.  No retractions.  Gastrointestinal: Soft and nontender.  Musculoskeletal:  no deformity Neurologic:  MAE spontaneously. No gross focal neurologic deficits are appreciated.  Skin:  Skin is warm, dry and intact. No rash noted. Psychiatric: Mood and affect are normal. Speech and behavior are normal.    ED Results / Procedures / Treatments    Labs (all labs ordered are listed, but only abnormal results are displayed) Labs Reviewed  BASIC METABOLIC PANEL - Abnormal; Notable for the following components:      Result Value   Glucose, Bld 126 (*)    Calcium 8.4 (*)    All other components within normal limits  CBC - Abnormal; Notable for the following components:   RBC 3.58 (*)    Hemoglobin 11.3 (*)    HCT 34.4 (*)    All other components within normal limits  URINALYSIS, ROUTINE W REFLEX MICROSCOPIC - Abnormal; Notable for the following components:   Color, Urine YELLOW (*)    APPearance CLEAR (*)    All other components within normal limits  CBG MONITORING, ED - Abnormal; Notable for the following components:   Glucose-Capillary 110 (*)    All other components within normal limits  TROPONIN I (HIGH SENSITIVITY)  TROPONIN I (HIGH SENSITIVITY)     EKG  ED ECG REPORT I, Merlyn Lot, the attending physician, personally viewed and interpreted this ECG.   Date: 05/19/2022  EKG Time: 10:01  Rate: 60  Rhythm: sinus  Axis: normal  Intervals:normal  ST&T Change:  no stemi    RADIOLOGY Please see ED Course for my review and interpretation.  I personally reviewed all radiographic images ordered to evaluate for the above acute complaints and reviewed radiology reports and findings.  These findings were personally discussed with the patient.  Please see medical record for radiology report.    PROCEDURES:  Critical Care performed:   Procedures   MEDICATIONS ORDERED IN ED: Medications  sodium chloride 0.9 % bolus 1,000 mL (0 mLs Intravenous Stopped 05/19/22 1142)     IMPRESSION / MDM / ASSESSMENT AND PLAN / ED COURSE  I reviewed the triage vital signs and the nursing notes.                              Differential diagnosis includes, but is not limited to, dehydration, electrolyte abnormality, orthostasis, AS, ACS, UTI, CVA seizure  Patient presenting to the ER for evaluation of symptoms as  described above.  Based on symptoms, risk factors and considered above differential, this presenting complaint could reflect a potentially life-threatening illness therefore the patient will be placed on continuous pulse oximetry and telemetry for monitoring.  Laboratory evaluation will be sent to evaluate for the above complaints.       Clinical Course as of 05/19/22 1400  Fri May 19, 2022  1114 Chest x-ray my review and interpretation without evidence of consolidation or edema. [PR]  [id number] Blood work with no significant AKI.  No leukocytosis.  Blood sugar normal. [PR]  1139 Chest x-ray my review and interpretation without evidence of consolidation or effusion.  CT head without evidence of acute intracranial abnormality.  Awaiting troponin. [PR]  1353 Patient reassessed.  Patient feeling well.  Given his age discussed option for observation the hospital patient and spouse at bedside states that he feels well suspect dehydration as he is feeling improved after IV fluids and feel comfortable with outpatient follow-up.  Given his otherwise reassuring workup I think that is reasonable. [PR]    Clinical Course User Index [PR] Merlyn Lot, MD      FINAL CLINICAL IMPRESSION(S) / ED DIAGNOSES   Final diagnoses:  Near syncope     Rx / DC Orders   ED Discharge Orders     None        Note:  This document was prepared using Dragon voice recognition software and may include unintentional dictation errors.    Merlyn Lot, MD 05/19/22 1400

## 2022-05-19 NOTE — ED Notes (Signed)
Pt educated on the need for a urine sample when he is able to urinate. Pts wife is at bedside.

## 2022-05-19 NOTE — ED Triage Notes (Signed)
Pt to the ED via [facility] from home for near syncopal episode this morning. Pt states that he has hx/o same within the last month. Pt reports that he was standing in the kitchen fixing breakfast and he got really hot and clammy. EMS reports that pts initial blood pressure was 98/50. EMS reports pts family stated that his blood pressure has been decreasing over the last week and they are concerned that the pt may be dehydrated. Pt denies any cardiac hx/o.

## 2022-05-19 NOTE — ED Notes (Signed)
Pt resting in bed at this time. Pt and wife deny any current needs.

## 2022-05-19 NOTE — ED Notes (Signed)
Pt wife a bedside

## 2022-05-23 ENCOUNTER — Ambulatory Visit: Payer: No Typology Code available for payment source

## 2022-05-23 ENCOUNTER — Encounter: Payer: No Typology Code available for payment source | Admitting: Speech Pathology

## 2022-05-25 ENCOUNTER — Ambulatory Visit: Payer: No Typology Code available for payment source | Admitting: Physical Therapy

## 2022-05-25 ENCOUNTER — Encounter: Payer: No Typology Code available for payment source | Admitting: Speech Pathology

## 2022-05-30 ENCOUNTER — Encounter: Payer: No Typology Code available for payment source | Admitting: Speech Pathology

## 2022-05-30 ENCOUNTER — Ambulatory Visit: Payer: No Typology Code available for payment source

## 2022-06-01 ENCOUNTER — Ambulatory Visit: Payer: No Typology Code available for payment source | Admitting: Physical Therapy

## 2022-06-01 ENCOUNTER — Encounter: Payer: No Typology Code available for payment source | Admitting: Speech Pathology

## 2022-06-06 ENCOUNTER — Encounter: Payer: No Typology Code available for payment source | Admitting: Speech Pathology

## 2022-06-06 ENCOUNTER — Ambulatory Visit: Payer: No Typology Code available for payment source

## 2022-06-08 ENCOUNTER — Ambulatory Visit: Payer: No Typology Code available for payment source | Admitting: Physical Therapy

## 2022-06-08 ENCOUNTER — Encounter: Payer: No Typology Code available for payment source | Admitting: Speech Pathology

## 2022-06-13 ENCOUNTER — Encounter: Payer: No Typology Code available for payment source | Admitting: Speech Pathology

## 2022-06-13 ENCOUNTER — Ambulatory Visit: Payer: No Typology Code available for payment source

## 2022-06-15 ENCOUNTER — Ambulatory Visit: Payer: No Typology Code available for payment source | Admitting: Physical Therapy

## 2022-06-15 ENCOUNTER — Encounter: Payer: No Typology Code available for payment source | Admitting: Speech Pathology

## 2022-06-20 ENCOUNTER — Ambulatory Visit: Payer: No Typology Code available for payment source

## 2022-06-22 ENCOUNTER — Encounter: Payer: No Typology Code available for payment source | Admitting: Speech Pathology

## 2022-06-22 ENCOUNTER — Ambulatory Visit: Payer: No Typology Code available for payment source | Admitting: Physical Therapy

## 2022-06-27 ENCOUNTER — Encounter: Payer: No Typology Code available for payment source | Admitting: Speech Pathology

## 2022-06-27 ENCOUNTER — Ambulatory Visit: Payer: No Typology Code available for payment source

## 2022-06-29 ENCOUNTER — Encounter: Payer: No Typology Code available for payment source | Admitting: Speech Pathology

## 2022-06-29 ENCOUNTER — Ambulatory Visit: Payer: No Typology Code available for payment source | Admitting: Physical Therapy

## 2023-12-18 NOTE — Progress Notes (Signed)
 Willie Mcdonald is a  89 y.o. male who presents for  CHIEF COMPLAINT Chief Complaint  Patient presents with  . Follow-up  . Hypertension  . Dementia  . Hyperlipidemia  . prediabetes    Subjective: History of Present Illness  Pt in NAD. HTN stable on meds. Has HLD not on statin due to myopathy and prediabetes not on meds. Now with worsening dementia and weight loss. Also with back pain and bedwetting. No fever. Denies Cp or SOB. No palpitations. Some constipation.    Past Medical History:  Diagnosis Date  . Anemia   . Back pain   . BPH (benign prostatic hypertrophy)   . Cholecystitis   . GERD (gastroesophageal reflux disease)   . History of cataract Left - 05/22/2018   Right - 06/12/2018  . Hyperlipidemia   . Hypertension    Patient Active Problem List  Diagnosis  . HTN (hypertension)  . Hyperlipidemia  . BPH with urinary obstruction  . Chronic back pain  . GERD (gastroesophageal reflux disease)  . Cholecystitis  . ED (erectile dysfunction) of organic origin  . Statin myopathy  . Memory disorder  . Prediabetes  . 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 CATARACT EXTRACTION    . TONSILLECTOMY  1949? - Approx     Current Outpatient Medications:  .  aspirin 81 MG EC tablet, Take 81 mg by mouth once daily., Disp: , Rfl:  .  cholecalciferol (VITAMIN D3) 400 unit tablet, Take 800 Units by mouth once daily, Disp: , Rfl:  .  cyanocobalamin (VITAMIN B12) 1000 MCG tablet, Take 1,000 mcg by mouth once daily, Disp: , Rfl:  .  DULoxetine (CYMBALTA) 20 MG DR capsule, Take 1 capsule (20 mg total) by mouth once daily (Patient taking differently: Take 40 mg by mouth once daily), Disp: 30 capsule, Rfl: 11 .  finasteride (PROSCAR) 5 mg tablet, Take 1 tablet by mouth once daily, Disp: 100 tablet, Rfl: 0 .  glycopyrrolate (ROBINUL) 1 mg tablet, Take 1 tablet by mouth twice daily,  Disp: 60 tablet, Rfl: 0 .  levothyroxine (SYNTHROID) 25 MCG tablet, Take 1 tablet (25 mcg total) by mouth once daily Take on an empty stomach with a glass of water at least 30-60 minutes before breakfast., Disp: 30 tablet, Rfl: 11 .  memantine (NAMENDA) 5 MG tablet, Take 1 tablet (5 mg total) by mouth 2 (two) times daily, Disp: 60 tablet, Rfl: 3 .  modafiniL (PROVIGIL) 100 MG tablet, Take 1 tablet (100 mg total) by mouth once daily, Disp: 30 tablet, Rfl: 5 .  omeprazole (PRILOSEC) 40 MG DR capsule, TAKE 1 CAPSULE BY MOUTH EVERY DAY, Disp: 90 capsule, Rfl: 3  Patient has no known allergies.  Social History   Socioeconomic History  . Marital status: Married  Tobacco Use  . Smoking status: Never  . Smokeless tobacco: Never  Vaping Use  . Vaping status: Never Used  Substance and Sexual Activity  . Alcohol use: No  . Drug use: Never  . Sexual activity: Yes    Partners: Female    Birth control/protection: None   Social Drivers of Health   Housing Stability: Unknown (06/19/2023)   Housing Stability Vital Sign   . Homeless in the Last Year: No    Family History  Problem Relation Name Age of Onset  . High blood pressure (Hypertension) Mother deceased   . Myocardial Infarction (Heart attack) Mother deceased   .  Stroke Mother deceased   . Coronary Artery Disease (Blocked arteries around heart) Father deceased   . Heart disease Father deceased   . Colon cancer Neg Hx    . Colon polyps Neg Hx      A comprehensive ROS was negative except for HPI  PE: BP 122/70   Pulse 79   Ht 165.1 cm (5' 5)   Wt 68 kg (150 lb)   SpO2 96%   BMI 24.96 kg/m  General. Alert oriented x3  Eyes. Sclera and conjunctiva clear; pupils equal round and reactive to light and accommodation; extraocular movements intact Nose. Mucosa healthy without drainage or ulceration Oropharynx. No suspicious lesions Neck. No swelling, masses, stiffness, pain, limited movement, carotid pulses normal bilaterally,  thyroid normal size, no masses palpated.  No bruits Lungs. Respirations unlabored; clear to auscultation bilaterally Back. No spinal deformity Cardiovascular. Heart regular rate and rhythm without murmurs, gallops, or rubs Abdomen. Soft; non tender; non distended; normoactive bowel sounds; no masses or organomegaly Lymph Nodes. No significant cervical, supraclavicular, axillary or inguinal lymphadenopathy noted Musculoskeletal. No deformities; no active joint inflammation Extremities. Normal, no edema Pulses. Dorsalis pedis palpable and symmetric bilaterally Neurologic. Alert and oriented; speech intact; face symmetrical; moves all extremities well  Office Visit on 10/18/2023  Component Date Value Ref Range Status  . WBC (White Blood Cell Count) 10/18/2023 12.2 (H)  4.1 - 10.2 10^3/uL Final  . RBC (Red Blood Cell Count) 10/18/2023 4.10 (L)  4.69 - 6.13 10^6/uL Final  . Hemoglobin 10/18/2023 13.1 (L)  14.1 - 18.1 gm/dL Final  . Hematocrit 92/89/7974 39.4 (L)  40.0 - 52.0 % Final  . MCV (Mean Corpuscular Volume) 10/18/2023 96.1  80.0 - 100.0 fl Final  . MCH (Mean Corpuscular Hemoglobin) 10/18/2023 32.0 (H)  27.0 - 31.2 pg Final  . MCHC (Mean Corpuscular Hemoglobin * 10/18/2023 33.2  32.0 - 36.0 gm/dL Final  . Platelet Count 10/18/2023 315  150 - 450 10^3/uL Final  . RDW-CV (Red Cell Distribution Widt* 10/18/2023 12.6  11.6 - 14.8 % Final  . MPV (Mean Platelet Volume) 10/18/2023 8.9 (L)  9.4 - 12.4 fl Final  . Neutrophils 10/18/2023 9.06 (H)  1.50 - 7.80 10^3/uL Final  . Lymphocytes 10/18/2023 2.11  1.00 - 3.60 10^3/uL Final  . Monocytes 10/18/2023 0.74  0.00 - 1.50 10^3/uL Final  . Eosinophils 10/18/2023 0.18  0.00 - 0.55 10^3/uL Final  . Basophils 10/18/2023 0.05  0.00 - 0.09 10^3/uL Final  . Neutrophil % 10/18/2023 74.4 (H)  32.0 - 70.0 % Final  . Lymphocyte % 10/18/2023 17.3  10.0 - 50.0 % Final  . Monocyte % 10/18/2023 6.1  4.0 - 13.0 % Final  . Eosinophil % 10/18/2023 1.5  1.0 - 5.0  % Final  . Basophil% 10/18/2023 0.4  0.0 - 2.0 % Final  . Immature Granulocyte % 10/18/2023 0.3  <=0.7 % Final  . Immature Granulocyte Count 10/18/2023 0.04  <=0.06 10^3/L Final  . Glucose 10/18/2023 111 (H)  70 - 110 mg/dL Final  . Sodium 92/89/7974 139  136 - 145 mmol/L Final  . Potassium 10/18/2023 4.4  3.6 - 5.1 mmol/L Final  . Chloride 10/18/2023 104  97 - 109 mmol/L Final  . Carbon Dioxide (CO2) 10/18/2023 30.1  22.0 - 32.0 mmol/L Final  . Urea Nitrogen (BUN) 10/18/2023 24  7 - 25 mg/dL Final  . Creatinine 92/89/7974 1.1  0.7 - 1.3 mg/dL Final  . Glomerular Filtration Rate (eGFR) 10/18/2023 65  >60 mL/min/1.73sq m  Final  . Calcium 10/18/2023 9.4  8.7 - 10.3 mg/dL Final  . AST  92/89/7974 20  8 - 39 U/L Final  . ALT  10/18/2023 27  6 - 57 U/L Final  . Alk Phos (alkaline Phosphatase) 10/18/2023 92  34 - 104 U/L Final  . Albumin 10/18/2023 3.9  3.5 - 4.8 g/dL Final  . Bilirubin, Total 10/18/2023 0.4  0.3 - 1.2 mg/dL Final  . Protein, Total 10/18/2023 6.6  6.1 - 7.9 g/dL Final  . A/G Ratio 92/89/7974 1.4  1.0 - 5.0 gm/dL Final  . Thyroid Stimulating Hormone (TSH) 10/18/2023 5.440 (H)  0.450-5.330 uIU/ml uIU/mL Final  . Color 10/22/2023 Yellow  Colorless, Straw, Light Yellow, Yellow, Dark Yellow Final  . Clarity 10/22/2023 Clear  Clear Final  . Specific Gravity 10/22/2023 1.026  1.005 - 1.030 Final  . pH, Urine 10/22/2023 5.5  5.0 - 8.0 Final  . Protein, Urinalysis 10/22/2023 Negative  Negative mg/dL Final  . Glucose, Urinalysis 10/22/2023 Negative  Negative mg/dL Final  . Ketones, Urinalysis 10/22/2023 Negative  Negative mg/dL Final  . Blood, Urinalysis 10/22/2023 Negative  Negative Final  . Nitrite, Urinalysis 10/22/2023 Negative  Negative Final  . Leukocyte Esterase, Urinalysis 10/22/2023 Negative  Negative Final  . Bilirubin, Urinalysis 10/22/2023 Negative  Negative Final  . Urobilinogen, Urinalysis 10/22/2023 0.2  0.2 - 1.0 mg/dL Final  . Mucous, Urine 10/22/2023 PRESENT (!)   None Seen Final  . WBC, UA 10/22/2023 2  <=5 /hpf Final  . Red Blood Cells, Urinalysis 10/22/2023 1  <=3 /hpf Final  . Bacteria, Urinalysis 10/22/2023 0-5  0 - 5 /hpf Final  . Squamous Epithelial Cells, Urinaly* 10/22/2023 0  /hpf Final  Office Visit on 06/19/2023  Component Date Value Ref Range Status  . WBC (White Blood Cell Count) 06/19/2023 10.6 (H)  4.1 - 10.2 10^3/uL Final  . RBC (Red Blood Cell Count) 06/19/2023 3.90 (L)  4.69 - 6.13 10^6/uL Final  . Hemoglobin 06/19/2023 12.6 (L)  14.1 - 18.1 gm/dL Final  . Hematocrit 96/88/7974 37.3 (L)  40.0 - 52.0 % Final  . MCV (Mean Corpuscular Volume) 06/19/2023 95.6  80.0 - 100.0 fl Final  . MCH (Mean Corpuscular Hemoglobin) 06/19/2023 32.3 (H)  27.0 - 31.2 pg Final  . MCHC (Mean Corpuscular Hemoglobin * 06/19/2023 33.8  32.0 - 36.0 gm/dL Final  . Platelet Count 06/19/2023 307  150 - 450 10^3/uL Final  . RDW-CV (Red Cell Distribution Widt* 06/19/2023 12.7  11.6 - 14.8 % Final  . MPV (Mean Platelet Volume) 06/19/2023 9.0 (L)  9.4 - 12.4 fl Final  . Neutrophils 06/19/2023 7.36  1.50 - 7.80 10^3/uL Final  . Lymphocytes 06/19/2023 1.92  1.00 - 3.60 10^3/uL Final  . Monocytes 06/19/2023 0.74  0.00 - 1.50 10^3/uL Final  . Eosinophils 06/19/2023 0.44  0.00 - 0.55 10^3/uL Final  . Basophils 06/19/2023 0.05  0.00 - 0.09 10^3/uL Final  . Neutrophil % 06/19/2023 69.7  32.0 - 70.0 % Final  . Lymphocyte % 06/19/2023 18.2  10.0 - 50.0 % Final  . Monocyte % 06/19/2023 7.0  4.0 - 13.0 % Final  . Eosinophil % 06/19/2023 4.2  1.0 - 5.0 % Final  . Basophil% 06/19/2023 0.5  0.0 - 2.0 % Final  . Immature Granulocyte % 06/19/2023 0.4  <=0.7 % Final  . Immature Granulocyte Count 06/19/2023 0.04  <=0.06 10^3/L Final  . Glucose 06/19/2023 106  70 - 110 mg/dL Final  . Sodium 96/88/7974 140  136 -  145 mmol/L Final  . Potassium 06/19/2023 4.5  3.6 - 5.1 mmol/L Final  . Chloride 06/19/2023 104  97 - 109 mmol/L Final  . Carbon Dioxide (CO2) 06/19/2023 29.9  22.0 -  32.0 mmol/L Final  . Urea Nitrogen (BUN) 06/19/2023 23  7 - 25 mg/dL Final  . Creatinine 96/88/7974 1.0  0.7 - 1.3 mg/dL Final  . Glomerular Filtration Rate (eGFR) 06/19/2023 73  >60 mL/min/1.73sq m Final  . Calcium 06/19/2023 9.0  8.7 - 10.3 mg/dL Final  . AST  96/88/7974 17  8 - 39 U/L Final  . ALT  06/19/2023 13  6 - 57 U/L Final  . Alk Phos (alkaline Phosphatase) 06/19/2023 107 (H)  34 - 104 U/L Final  . Albumin 06/19/2023 3.8  3.5 - 4.8 g/dL Final  . Bilirubin, Total 06/19/2023 0.4  0.3 - 1.2 mg/dL Final  . Protein, Total 06/19/2023 6.5  6.1 - 7.9 g/dL Final  . A/G Ratio 96/88/7974 1.4  1.0 - 5.0 gm/dL Final  . Thyroid Stimulating Hormone (TSH) 06/19/2023 5.569 (H)  0.450-5.330 uIU/ml uIU/mL Final  . Hemoglobin A1C 06/19/2023 5.9 (H)  4.2 - 5.6 % Final  . Average Blood Glucose (Calc) 06/19/2023 123  mg/dL Final  Office Visit on 02/19/2023  Component Date Value Ref Range Status  . Cholesterol, Total 02/19/2023 188  100 - 200 mg/dL Final  . Triglyceride 88/88/7975 102  35 - 199 mg/dL Final  . HDL (High Density Lipoprotein) Cho* 02/19/2023 50.0  29.0 - 71.0 mg/dL Final  . LDL Calculated 02/19/2023 881  0 - 130 mg/dL Final  . VLDL Cholesterol 02/19/2023 20  mg/dL Final  . Cholesterol/HDL Ratio 02/19/2023 3.8   Final  . WBC (White Blood Cell Count) 02/19/2023 10.2  4.1 - 10.2 10^3/uL Final  . RBC (Red Blood Cell Count) 02/19/2023 3.50 (L)  4.69 - 6.13 10^6/uL Final  . Hemoglobin 02/19/2023 11.6 (L)  14.1 - 18.1 gm/dL Final  . Hematocrit 88/88/7975 33.6 (L)  40.0 - 52.0 % Final  . MCV (Mean Corpuscular Volume) 02/19/2023 96.0  80.0 - 100.0 fl Final  . MCH (Mean Corpuscular Hemoglobin) 02/19/2023 33.1 (H)  27.0 - 31.2 pg Final  . MCHC (Mean Corpuscular Hemoglobin * 02/19/2023 34.5  32.0 - 36.0 gm/dL Final  . Platelet Count 02/19/2023 261  150 - 450 10^3/uL Final  . RDW-CV (Red Cell Distribution Widt* 02/19/2023 12.8  11.6 - 14.8 % Final  . MPV (Mean Platelet Volume) 02/19/2023 8.8 (L)   9.4 - 12.4 fl Final  . Neutrophils 02/19/2023 6.69  1.50 - 7.80 10^3/uL Final  . Lymphocytes 02/19/2023 1.88  1.00 - 3.60 10^3/uL Final  . Monocytes 02/19/2023 0.65  0.00 - 1.50 10^3/uL Final  . Eosinophils 02/19/2023 0.91 (H)  0.00 - 0.55 10^3/uL Final  . Basophils 02/19/2023 0.03  0.00 - 0.09 10^3/uL Final  . Neutrophil % 02/19/2023 65.7  32.0 - 70.0 % Final  . Lymphocyte % 02/19/2023 18.5  10.0 - 50.0 % Final  . Monocyte % 02/19/2023 6.4  4.0 - 13.0 % Final  . Eosinophil % 02/19/2023 8.9 (H)  1.0 - 5.0 % Final  . Basophil% 02/19/2023 0.3  0.0 - 2.0 % Final  . Immature Granulocyte % 02/19/2023 0.2  <=0.7 % Final  . Immature Granulocyte Count 02/19/2023 0.02  <=0.06 10^3/L Final  . Glucose 02/19/2023 91  70 - 110 mg/dL Final  . Sodium 88/88/7975 141  136 - 145 mmol/L Final  . Potassium 02/19/2023 4.2  3.6 -  5.1 mmol/L Final  . Chloride 02/19/2023 104  97 - 109 mmol/L Final  . Carbon Dioxide (CO2) 02/19/2023 31.3  22.0 - 32.0 mmol/L Final  . Urea Nitrogen (BUN) 02/19/2023 24  7 - 25 mg/dL Final  . Creatinine 88/88/7975 1.0  0.7 - 1.3 mg/dL Final  . Glomerular Filtration Rate (eGFR) 02/19/2023 73  >60 mL/min/1.73sq m Final  . Calcium 02/19/2023 8.8  8.7 - 10.3 mg/dL Final  . AST  88/88/7975 15  8 - 39 U/L Final  . ALT  02/19/2023 12  6 - 57 U/L Final  . Alk Phos (alkaline Phosphatase) 02/19/2023 91  34 - 104 U/L Final  . Albumin 02/19/2023 3.7  3.5 - 4.8 g/dL Final  . Bilirubin, Total 02/19/2023 0.4  0.3 - 1.2 mg/dL Final  . Protein, Total 02/19/2023 6.1  6.1 - 7.9 g/dL Final  . A/G Ratio 88/88/7975 1.5  1.0 - 5.0 gm/dL Final  . Thyroid Stimulating Hormone (TSH) 02/19/2023 3.558  0.450-5.330 uIU/ml uIU/mL Final  . Color 02/20/2023 Yellow  Colorless, Straw, Light Yellow, Yellow, Dark Yellow Final  . Clarity 02/20/2023 Clear  Clear Final  . Specific Gravity 02/20/2023 1.018  1.005 - 1.030 Final  . pH, Urine 02/20/2023 6.0  5.0 - 8.0 Final  . Protein, Urinalysis 02/20/2023 Negative   Negative mg/dL Final  . Glucose, Urinalysis 02/20/2023 Negative  Negative mg/dL Final  . Ketones, Urinalysis 02/20/2023 Negative  Negative mg/dL Final  . Blood, Urinalysis 02/20/2023 Negative  Negative Final  . Nitrite, Urinalysis 02/20/2023 Negative  Negative Final  . Leukocyte Esterase, Urinalysis 02/20/2023 Negative  Negative Final  . Bilirubin, Urinalysis 02/20/2023 Negative  Negative Final  . Urobilinogen, Urinalysis 02/20/2023 0.2  0.2 - 1.0 mg/dL Final  . WBC, UA 88/87/7975 1  <=5 /hpf Final  . Red Blood Cells, Urinalysis 02/20/2023 1  <=3 /hpf Final  . Bacteria, Urinalysis 02/20/2023 0-5  0 - 5 /hpf Final  . Squamous Epithelial Cells, Urinaly* 02/20/2023 0  /hpf Final  . Hemoglobin A1C 02/19/2023 5.7 (H)  4.2 - 5.6 % Final  . 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 (TP)  Reyes JONETTA Costa, MD, MD

## 2024-01-01 NOTE — Progress Notes (Signed)
 Ref Provider: Self PCP: Willie Reyes BIRCH, MD Assessment and Plan:   In most patients we give written parts of assessment and plan to patient under Patient Instructions/After Visit Summary. So some parts are directed to patient.  Dear [name], It was our pleasure to participate in your care. We have typed up brief summary of what we discussed. 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 Memory is perceived as worsening by family. He requires assistance with most activities of daily living. Discussed the progressive nature of the condition and the importance of comfort and quality of life. Emphasized non-pharmacological interventions and the limited benefit of current medications at this stage. Discussed potential risks such as aspiration pneumonia, dehydration, urinary tract infections, and falls. Highlighted the importance of maintaining mobility and engagement in activities. Explained that memantine may delay nursing home placement by three months but does not stop or reverse the disease. Discussed the option of weaning off medications with a gradual reduction plan.  - Provide family with '36 Hours a Day' book and [name]. The 36-Hour Daybook is a guide for families and caregivers of patients with dementia, written by Willie Mcdonald and Dr. Maude Mcdonald. This book provides helpful information about what to expect and how to care for those with  Alzheimer's, Vascular Dementia, or other causes of dementia.  Recommend looking into [name].  skunkalert.co.nz - Encourage exposure to sunlight in the morning to help regulate circadian rhythm. - Advise on non-pharmacological interventions to stimulate eating and engagement. - Recommend avoiding distressing TV content; suggest [name] or music from his youth. - Discuss potential weaning off donepezil and memantine with a gradual reduction plan: reduce donepezil to half a pill for one week, then stop; after one month, reduce memantine to 5 mg once a day for one week, then stop.  Decrease Donepezil dose to half pill (5 mg) for 1 week then stop 1 month later, reduce Memantine 5 mg once a day for 1 week then stop. Refill medications just in case they need to restart it. - Ensure fall prevention measures are in place, including motion sensor lights and removal of tripping hazards. There are a few interventions the patient should consider to prevent falls: Be careful, Avoid high heel footwear, Use appropriate assistive device, Avoid hazards in your path,  (toys, pets, wires, etc.), Have good light when you walk, Use a nonskid shower mat/shower chair, Use handrails and grab bars. Check out www.stopfalls.org for more information. - Encourage physical therapy and engagement in safe, meaningful activities.  We talked about the staging of dementia. On the continuum, stage 0 is the starting point where there is a deterministic gene present, but there are no symptoms and no evidence of clinical change. In stage 1 there is biomarker evidence present, but performance on cognitive tests remain within the expected range. There is no sign of cognitive decline. In stage 2 we start to see some mild changes and transitional decline. This includes a decline from previous level of cognitive or  neurobehavioral function that represents  a persistent change in the individual within the past 1-3 years. The individual does still remain fully independent with no impact on daily functioning. Progression continues in stage 3 as cognitive impairment with early functional impact becomes present. There is evidence of a decline from the individual's baseline and performance on cognitive tests is abnormal. The individual does perform daily activities independently, but there is a noticeable decline in efficiency and function. Stage 4 showcases progressive cognitive and mild functional impairment on daily activities, but there is still independence on basic daily activities. Stage 5 is where assistance is required on all daily activities and moderate functional impairment is present. In stage 6, dementia is progressive and functional impairment is severe. There is complete dependence for basic daily activities.   Patient and family should be aware of sundowning, which can manifest as increased confusion, anxiety, pacing, agitation and disorientation beginning at dusk and continuing throughout the night. Reducing triggers and distractions are important. Keep home well lit. Do not physically restrain person. Taking a walk outside (weather permitting). Archivemy.no has more information.  Psychosocial interventions for the management of behavioral and psychotic symptoms in patients with dementia - Routine activity. - Separate the person from what seems to be upsetting him or her. - Assess for the presence of pain, constipation, or other physical problem. - Review medications, especially new medications. - Travel with them to where they are in time. - Don't disagree; respect the person's thoughts even if incorrect. - Physical interaction: Maintain eye contact, get to their height level, and allow space. - Speak slowly and calmly in a normal tone of voice. The person may not understand the words spoken, but he or  she may pick up the tone of the voice behind the words and respond to that. - Avoid finger-pointing, scolding, or threatening. - Redirect the person to participate in an enjoyable activity or offer comfort food he or she may recognize and like. - If you appear to be the cause of the problem, leave the room for a while. - Validate that the person seems to be upset over something. Reassure the person that you want to help and that you love him or her. - Avoid asking the person to do what appears to trigger an agitated or aggressive response.  Look into Karlyne Orange, a dementia care specialist who has great advise for those caring for loved ones with dementia. Go to her website, www.teepasnow.com to learn more about her and her recommendations for care givers.  Dr. Maree has created some playlists on his YouTube channel that may benefit you. Consider going to: [email or web address].youtube.com/@alz .prevention/playlists  Other ways to search: go to [email or web address].gl. Once there, find the search bar and type in [email or web address]. Select Dr. Jamal channel. Here you can click on the Playlists Tab and find the playlist that have been recommended to you.   CONSIDERED COMORBIDITIES BELOW Vitamin D deficiency - 27.4 on 01/11/2022  Gustatory Rhinitis   Follow up in 6-8 months with Kaitlin Paich, PA-C Return in about 6 months (around 06/30/2024) for with Kaitlin Paich, PA-C. This note has been created using automated tools and reviewed for accuracy by [facility]. I spent a total of 41 minutes in both face-to-face and non-face-to-face activities, excluding procedures performed, for this visit on the date of this encounter.  Interim History date 01/01/2024   Willie Mcdonald is a 89 y.o. male here for treatment and evaluation of Dementia   Willie Mcdonald last visit was on 02/16/2023  Patient states he  feels his memory is stable.  Per family his memory is worse.  He has more trouble with communication.  He requires assistance with all  Activities of Daily Living other than feeding himself.      History of Present Illness Willie Mcdonald is an 89 year old male with mixed dementia, Alzheimer's, and [name] pathology who presents for follow-up. He is accompanied by his son.  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 - Memory perceived as stable by patient, but family observes decline, particularly in communication - Disoriented to time and place - Requires assistance with all activities of daily living except feeding - Significant decline in function over the past three months - Occasionally jokes and appears content and happy  Nutritional status and weight loss - Decreased appetite and frequent refusal to eat - Ongoing weight loss - Family considering allowing preferred foods such as ice cream to encourage intake  Behavioral and psychological symptoms of dementia - Potential sundowning episodes - Enjoys watching TV, especially [place] - Family monitoring TV content to avoid anxiety or agitation  I reviewed labs, imaging, and notes in [place], [place], and from outside providers, if available.   Results    Disease Summary: (Aggregate of information from previous visits)   Willie Mcdonald is a right handed 89 y.o. male retiree here for evaluation of Dementia , referred by Self.   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 Onset has been gradual. Notes cognitive symptoms such as misplacing items, increased forgetfulness, forgetting names,  difficulty learning new things - he is unable to remember how to start the new lawn mower. Patient no longer manages medications (family sets them up). Family also notes change in behavior - he is more withdrawn. Of note, patient has hearing impairment. He continues to drive without mistakes, his wife is always in the care with him. Denies hallucinations, confusion between day and night. He does not require assistance with Activities of Daily Living. He has no history of head trauma, ADD/ADHD, family history of dementia. He is taking donepezil 10 mg by mouth daily.   We were not able to finish SLUMS Examination due to patient's cognitive / emotional limitations.  Patient has additional symptoms of short steps, shuffling gait, onset 2021 per wife. He has hunched forward posture, decreased arm swing, softening in his voice, bilateral hand tremor. Patient denies any falls, his wife is worried about that risk.    Completed Speech Therapy for cognitive training in November 2023.   MRI Brain 11/01/2021: No acute intracranial process. Overall cerebral atrophy is likely within normal limits for age, with possible slightly more advanced atrophy in the left-greater-than-right temporal lobe.   Medications: donepezil, Vitamin B12, Namenda  Labs 12/07/2021: Vitamin B12, TSH, CBC, CMP, HbA1C  Reviewed Labs (03/15/2022): Vit B1, Vit D, folate, Treponema Pallidum (syphilis) screening cascade: Patient should increase by or start supplementing with 1000 units of Vitamin D3 once a day. Patient should get Vit D checked in 3 months to make sure levels have improved. The rest of the labs are ok with minor variations.  Patient and wife report that memory has been  stable. He had graduated from Physical Therapy for gait issues in 2 months, wife is concerned that they released him too early. Patient had discontinue Namenda after 2 weeks due to side effect of bilateral eye swelling, was unsure if this is a side effect of the  medication but had discontinued it. We will restart again today and monitor symptoms. We will also refer patient to Physical Therapy for balance.   Degenerative disc disease with hunched forward posture in patient with [name] Dementia: Receiving Physical Therapy.   Vitamin D deficiency - 27.4 on 01/11/2022: Supplementing.   Physical Exam   Vitals Vitals:   01/01/24 1447  BP: 113/67  Pulse: 71  Weight: 68 kg (150 lb)  Height: 165.1 cm (5' 5)  PainSc: 0-No pain   Body mass index is 24.96 kg/m.  (Some of the exam changes noted are from previous clinical observations)  Physical Exam   General Exam Significant kyphosis   Neurological Exam  Resting right hand tremor  SLUMS 01/11/2022:  Level of education 12th grade   0/1 1. What day of the week is it?  0/1 2. What is the year?  0/1 3. What state are we in?   4. Please remember these five objects. I will ask you what they are later.      Apple  Pen  [employer]  We were not able to finish SLUMS Examination due to patient's cognitive / emotional limitations.  03/15/2022:  Day: Correct  Month: Incorrect  Year: Incorrect  # of Children and Grandchildren: Correct Children names: Correct Grandchildren names: Incorrect  08/14/2022 Years married: could not remember Home address: could not remember Number of kids: correct Number of grand kids: correct Names of grand kids: could not remember Siblings: correct  Medications: Current Outpatient Medications on File Prior to Visit  Medication Sig Dispense Refill  . aspirin 81 MG EC tablet Take 81 mg by mouth once daily.    . carbidopa-levodopa (SINEMET) 25-100 mg tablet Take 1 tablet by mouth at bedtime 30 tablet 11  . cefUROXime  (CEFTIN ) 250 MG tablet Take 1 tablet (250 mg total) by mouth 2 (two) times daily for 10 days 20 tablet 0  . cholecalciferol (VITAMIN D3) 400 unit tablet Take 800 Units by mouth once daily    . cyanocobalamin (VITAMIN B12) 1000 MCG tablet  Take 1,000 mcg by mouth once daily    . levothyroxine (SYNTHROID) 25 MCG tablet Take 1 tablet (25 mcg total) by mouth once daily Take on an empty stomach with a glass of water at least 30-60 minutes before breakfast. 30 tablet 11  . metaxalone (SKELAXIN) 800 mg tablet Take 1 tablet (800 mg total) by mouth 2 (two) times daily 60 tablet 0  . modafiniL (PROVIGIL) 100 MG tablet Take 1 tablet (100 mg total) by mouth once daily 30 tablet 5  . omeprazole (PRILOSEC) 40 MG DR capsule TAKE 1 CAPSULE BY MOUTH EVERY DAY 90 capsule 3   No current facility-administered medications on file prior to visit.   Past Medical History:  Past Medical History:  Diagnosis Date  . Anemia   . Back pain   . BPH (benign prostatic hypertrophy)   . Cholecystitis   . GERD (gastroesophageal reflux disease)   . History of cataract Left - 05/22/2018   Right - 06/12/2018  . Hyperlipidemia   . Hypertension     Past Surgical History:  Past Surgical History:  Procedure Laterality Date  . CATARACT EXTRACTION    .  TONSILLECTOMY  1951? - Approx   Family History:  Family History  Problem Relation Name Age of Onset  . High blood pressure (Hypertension) Mother Mother   . Myocardial Infarction (Heart attack) Mother Mother   . Stroke Mother Mother   . Coronary Artery Disease (Blocked arteries around heart) Father Father   . Heart disease Father Father   . Colon cancer Neg Hx    . Colon polyps Neg Hx     Social History:  Social History   Socioeconomic History  . Marital status: Married  Tobacco Use  . Smoking status: Never  . Smokeless tobacco: Never  Vaping Use  . Vaping status: Never Used  Substance and Sexual Activity  . Alcohol use: No  . Drug use: Never  . Sexual activity: Yes    Partners: Female    Birth control/protection: None   Social Drivers of Health   Housing Stability: Unknown (06/19/2023)   Housing Stability Vital Sign   . Homeless in the Last Year: No   Allergies: No Known Allergies  Dr.  Jannett Fairly

## 2024-02-23 ENCOUNTER — Other Ambulatory Visit: Payer: Self-pay

## 2024-02-23 ENCOUNTER — Emergency Department

## 2024-02-23 ENCOUNTER — Emergency Department
Admission: EM | Admit: 2024-02-23 | Discharge: 2024-02-23 | Disposition: A | Discharge status: Home / Self Care | Attending: Emergency Medicine | Admitting: Emergency Medicine

## 2024-02-23 ENCOUNTER — Encounter: Payer: Self-pay | Admitting: Intensive Care

## 2024-02-23 DIAGNOSIS — F0390 Unspecified dementia without behavioral disturbance: Secondary | ICD-10-CM | POA: Insufficient documentation

## 2024-02-23 DIAGNOSIS — M545 Low back pain, unspecified: Secondary | ICD-10-CM | POA: Diagnosis present

## 2024-02-23 DIAGNOSIS — M546 Pain in thoracic spine: Secondary | ICD-10-CM | POA: Diagnosis not present

## 2024-02-23 DIAGNOSIS — G20A1 Parkinson's disease without dyskinesia, without mention of fluctuations: Secondary | ICD-10-CM | POA: Insufficient documentation

## 2024-02-23 DIAGNOSIS — I10 Essential (primary) hypertension: Secondary | ICD-10-CM | POA: Insufficient documentation

## 2024-02-23 LAB — URINALYSIS, W/ REFLEX TO CULTURE (INFECTION SUSPECTED)
Bacteria, UA: NONE SEEN
Bilirubin Urine: NEGATIVE
Glucose, UA: NEGATIVE mg/dL
Ketones, ur: NEGATIVE mg/dL
Leukocytes,Ua: NEGATIVE
Nitrite: NEGATIVE
Protein, ur: NEGATIVE mg/dL
Specific Gravity, Urine: 1.016 (ref 1.005–1.030)
Squamous Epithelial / HPF: 0 /HPF (ref 0–5)
pH: 6 (ref 5.0–8.0)

## 2024-02-23 LAB — CBC
HCT: 43 % (ref 39.0–52.0)
Hemoglobin: 14.3 g/dL (ref 13.0–17.0)
MCH: 31.8 pg (ref 26.0–34.0)
MCHC: 33.3 g/dL (ref 30.0–36.0)
MCV: 95.6 fL (ref 80.0–100.0)
Platelets: 373 K/uL (ref 150–400)
RBC: 4.5 MIL/uL (ref 4.22–5.81)
RDW: 12.3 % (ref 11.5–15.5)
WBC: 11.3 K/uL — ABNORMAL HIGH (ref 4.0–10.5)
nRBC: 0 % (ref 0.0–0.2)

## 2024-02-23 LAB — BASIC METABOLIC PANEL WITH GFR
Anion gap: 8 (ref 5–15)
BUN: 16 mg/dL (ref 8–23)
CO2: 27 mmol/L (ref 22–32)
Calcium: 9.6 mg/dL (ref 8.9–10.3)
Chloride: 103 mmol/L (ref 98–111)
Creatinine, Ser: 1.02 mg/dL (ref 0.61–1.24)
GFR, Estimated: >60 mL/min (ref >60)
Glucose, Bld: 103 mg/dL — ABNORMAL HIGH (ref 70–99)
Potassium: 4.3 mmol/L (ref 3.5–5.1)
Sodium: 138 mmol/L (ref 135–145)

## 2024-02-23 LAB — TROPONIN T, HIGH SENSITIVITY
Troponin T High Sensitivity: 34 ng/L — ABNORMAL HIGH (ref 0–19)
Troponin T High Sensitivity: 35 ng/L — ABNORMAL HIGH (ref 0–19)

## 2024-02-23 MED ORDER — OLANZAPINE 10 MG IM SOLR
5.0000 mg | Freq: Once | INTRAMUSCULAR | Status: DC
  Filled 2024-02-23: qty 10

## 2024-02-23 MED ORDER — LIDOCAINE 5 % EX PTCH: 1.0000 | MEDICATED_PATCH | CUTANEOUS | 0 refills | Status: AC

## 2024-02-23 NOTE — ED Notes (Signed)
 The pt was assisted with placing his pants and brief. The pt does have a hx of dementia and was fighting us  to sit up and be placed in the wheel chair. The pt was assisted to the wheel chair without incident. The pt pushing away from us  and fighting us  placing a sweatshirt on. The pt was then wheeled out to the pt's family's vehicle. The pt was assisted with standing and pivoting to the car by [employer] and a family member. The pt was placed in the car without incident.

## 2024-02-23 NOTE — ED Provider Notes (Signed)
 [facility] Provider Note    Event Date/Time   First MD Initiated Contact with Patient 02/23/24 1411     (approximate)   History   Back Pain   HPI  Willie Mcdonald is a 89 y.o. male with history of dementia, Parkinson's, presenting with back pain.  Started complaining about it today.  He denies any fever, no urinary symptoms, no abdominal pain, no focal weakness or numbness.  Wife reported that he fell several weeks ago but no recent falls.  He had not been complaining of any urinary symptoms, no reported nausea vomiting or fever.  Wife states that they have a caregiver who is there at 6-8 hours a day, she does not need additional help at home.  States that he is now mostly bedbound and wheelchair-bound.  States that as they were getting him up today, he complained about left upper back pain.  He denies any pain at this time.  No chest pain or shortness of breath.  No coughing.    Per independent history from EMS, his blood glucose was 89, heart rate of 79, afebrile.  Independent history obtained from wife as above.  On independent chart review, he was seen by primary care in September, history of hypertension, stable meds, has dementia, was also complaining of low back pain at the time, family had noted bedwetting and some constipation for primary care.  He also saw neurology in September, has moderate to advanced dementia, requires assistance with most activities of daily living.  They were also weaning him off his donepezil and memantine.   Physical Exam   Triage Vital Signs: ED Triage Vitals  Encounter Vitals Group     BP 02/23/24 1306 132/79     Girls Systolic BP Percentile --      Girls Diastolic BP Percentile --      Boys Systolic BP Percentile --      Boys Diastolic BP Percentile --      Pulse Rate 02/23/24 1306 79     Resp 02/23/24 1306 18     Temp 02/23/24 1306 99.2 F (37.3 C)     Temp Source 02/23/24 1306 Oral     SpO2 02/23/24 1306 100 %      Weight 02/23/24 1307 180 lb (81.6 kg)     Height 02/23/24 1307 5' 8 (1.727 m)     Head Circumference --      Peak Flow --      Pain Score --      Pain Loc --      Pain Education --      Exclude from Growth Chart --     Most recent vital signs: Vitals:   02/23/24 1306 02/23/24 1700  BP: 132/79 (!) 135/99  Pulse: 79   Resp: 18   Temp: 99.2 F (37.3 C)   SpO2: 100%      General: Awake, no distress.  CV:  Good peripheral perfusion.  Resp:  Normal effort.  No thoracic cage tenderness, no tachypnea or respiratory distress Abd:  No distention.  Soft nontender Other:  No midline spinal tenderness, no tenderness to his back, able to fully passively range all 4 extremities without bony tenderness, no focal weakness, no pressure sores.   ED Results / Procedures / Treatments   Labs (all labs ordered are listed, but only abnormal results are displayed) Labs Reviewed  CBC - Abnormal; Notable for the following components:      Result Value  WBC 11.3 (*)    All other components within normal limits  BASIC METABOLIC PANEL WITH GFR - Abnormal; Notable for the following components:   Glucose, Bld 103 (*)    All other components within normal limits  URINALYSIS, W/ REFLEX TO CULTURE (INFECTION SUSPECTED) - Abnormal; Notable for the following components:   Color, Urine YELLOW (*)    APPearance CLEAR (*)    Hgb urine dipstick SMALL (*)    All other components within normal limits  TROPONIN T, HIGH SENSITIVITY - Abnormal; Notable for the following components:   Troponin T High Sensitivity 34 (*)    All other components within normal limits  TROPONIN T, HIGH SENSITIVITY - Abnormal; Notable for the following components:   Troponin T High Sensitivity 35 (*)    All other components within normal limits     EKG  EKG shows, sinus, rate 83, normal QS, normal QTc, no obvious ischemic ST elevation, baseline wandering due to patient movement but T wave is flattened in aVL, no obvious  ischemic ST elevation, T wave change is not new compared to prior   RADIOLOGY On my independent interpretation, chest x-ray without obvious consolidation   PROCEDURES:  Critical Care performed: No  Procedures   MEDICATIONS ORDERED IN ED: Medications  OLANZapine (ZYPREXA) injection 5 mg (has no administration in time range)     IMPRESSION / MDM / ASSESSMENT AND PLAN / ED COURSE  I reviewed the triage vital signs and the nursing notes.                              Differential diagnosis includes, but is not limited to, musculoskeletal pain, strain, electrolyte treatments, atypical ACS, UTI.  Will get labs, EKG, troponin, checks x-ray.  Patient's presentation is most consistent with acute presentation with potential threat to life or bodily function.  Independent interpretation of labs and imaging below.  Clinical course as below.  Labs and imaging been reassuring, no recurrence of pain was noted while he was in the emergency department.  Discussed with wife about imaging and lab results.  Will send a prescription for Lidoderm  patches to the pharmacy just in case pain recurs.  Instructed wife to have him follow-up with primary care next week to get reassessed.  Otherwise he stable for outpatient management.  Considered but no indication for inpatient admission at this time.  Will discharge with strict return precautions.    Clinical Course as of 02/23/24 1733  Sat Feb 23, 2024  1441 DG Chest 1 View No active disease.  [TT]  1658 Independent review of labs, my leukocytosis, electrolytes not severely deranged, UA is not consistent with UTI, this shows a microscopic hematuria, could have been from straight cath, no gross hematuria, he is not also complaining about flank or back pain at this time.  Troponin is mildly elevated, will repeat. [TT]  1659 Patient's wife is having some difficulty redirecting him, he is continually try to get out of bed, not verbally redirectable, is a fall  risk, will give him a dose of Zyprexa here. [TT]  1731 Troponin T, High Sensitivity(!) Troponin x 2 is stable [TT]  1731 Patient was observed in the emergency department, no recurrence of his pain. [TT]    Clinical Course User Index [TT] Waymond Lorelle Cummins, MD     FINAL CLINICAL IMPRESSION(S) / ED DIAGNOSES   Final diagnoses:  Acute left-sided thoracic back pain  Dementia, unspecified dementia  severity, unspecified dementia type, unspecified whether behavioral, psychotic, or mood disturbance or anxiety (HCC)     Rx / DC Orders   ED Discharge Orders          Ordered    lidocaine  (LIDODERM ) 5 %  Every 24 hours        02/23/24 1708             Note:  This document was prepared using Dragon voice recognition software and may include unintentional dictation errors.    Waymond Lorelle Cummins, MD 02/23/24 [id number]

## 2024-02-23 NOTE — ED Triage Notes (Signed)
 Arrived by [facility] from home. Wife reports patient has been bed bound for a few weeks. Had previously been able to pivot to wheelchair but no longer able to do so. Unable to sit up in wheelchair recently. Patient c/o back pain that started today.   Has advanced Dementia and parkinson's  EMS vitals: 112/82 79HR 98.5oral 89CBG

## 2024-03-15 ENCOUNTER — Other Ambulatory Visit: Payer: Self-pay

## 2024-03-15 ENCOUNTER — Encounter: Payer: Self-pay | Admitting: Emergency Medicine

## 2024-03-15 ENCOUNTER — Emergency Department
Admission: EM | Admit: 2024-03-15 | Discharge: 2024-03-15 | Disposition: A | Discharge status: Home / Self Care | Attending: Emergency Medicine | Admitting: Emergency Medicine

## 2024-03-15 DIAGNOSIS — N390 Urinary tract infection, site not specified: Secondary | ICD-10-CM

## 2024-03-15 DIAGNOSIS — R319 Hematuria, unspecified: Secondary | ICD-10-CM | POA: Insufficient documentation

## 2024-03-15 DIAGNOSIS — F0390 Unspecified dementia without behavioral disturbance: Secondary | ICD-10-CM | POA: Insufficient documentation

## 2024-03-15 LAB — COMPREHENSIVE METABOLIC PANEL WITH GFR
ALT: 11 U/L (ref 0–44)
AST: 19 U/L (ref 15–41)
Albumin: 3.9 g/dL (ref 3.5–5.0)
Alkaline Phosphatase: 99 U/L (ref 38–126)
Anion gap: 13 (ref 5–15)
BUN: 24 mg/dL — ABNORMAL HIGH (ref 8–23)
CO2: 25 mmol/L (ref 22–32)
Calcium: 9.7 mg/dL (ref 8.9–10.3)
Chloride: 103 mmol/L (ref 98–111)
Creatinine, Ser: 1.18 mg/dL (ref 0.61–1.24)
GFR, Estimated: 60 mL/min — ABNORMAL LOW (ref >60)
Glucose, Bld: 175 mg/dL — ABNORMAL HIGH (ref 70–99)
Potassium: 4 mmol/L (ref 3.5–5.1)
Sodium: 141 mmol/L (ref 135–145)
Total Bilirubin: 0.5 mg/dL (ref 0.0–1.2)
Total Protein: 6.9 g/dL (ref 6.5–8.1)

## 2024-03-15 LAB — CBC WITH DIFFERENTIAL/PLATELET
Abs Immature Granulocytes: 0.08 K/uL — ABNORMAL HIGH (ref 0.00–0.07)
Basophils Absolute: 0.1 K/uL (ref 0.0–0.1)
Basophils Relative: 0 %
Eosinophils Absolute: 0.1 K/uL (ref 0.0–0.5)
Eosinophils Relative: 1 %
HCT: 42.7 % (ref 39.0–52.0)
Hemoglobin: 14.3 g/dL (ref 13.0–17.0)
Immature Granulocytes: 1 %
Lymphocytes Relative: 9 %
Lymphs Abs: 1.5 K/uL (ref 0.7–4.0)
MCH: 31.7 pg (ref 26.0–34.0)
MCHC: 33.5 g/dL (ref 30.0–36.0)
MCV: 94.7 fL (ref 80.0–100.0)
Monocytes Absolute: 0.9 K/uL (ref 0.1–1.0)
Monocytes Relative: 5 %
Neutro Abs: 14.8 K/uL — ABNORMAL HIGH (ref 1.7–7.7)
Neutrophils Relative %: 84 %
Platelets: 329 K/uL (ref 150–400)
RBC: 4.51 MIL/uL (ref 4.22–5.81)
RDW: 12.5 % (ref 11.5–15.5)
WBC: 17.5 K/uL — ABNORMAL HIGH (ref 4.0–10.5)
nRBC: 0 % (ref 0.0–0.2)

## 2024-03-15 LAB — URINALYSIS, W/ REFLEX TO CULTURE (INFECTION SUSPECTED)
Bilirubin Urine: NEGATIVE
Glucose, UA: NEGATIVE mg/dL
Ketones, ur: NEGATIVE mg/dL
Nitrite: NEGATIVE
Protein, ur: 100 mg/dL — AB
RBC / HPF: >50 RBC/hpf (ref 0–5)
Specific Gravity, Urine: 1.021 (ref 1.005–1.030)
Squamous Epithelial / HPF: 0 /HPF (ref 0–5)
WBC, UA: >50 WBC/hpf (ref 0–5)
pH: 5 (ref 5.0–8.0)

## 2024-03-15 MED ORDER — CEFUROXIME AXETIL 250 MG PO TABS: 250.0000 mg | ORAL_TABLET | Freq: Two times a day (BID) | ORAL | 0 refills | Status: DC

## 2024-03-15 MED ORDER — LACTATED RINGERS IV BOLUS
1000.0000 mL | Freq: Once | INTRAVENOUS | Status: AC
  Administered 2024-03-15: 1000 mL via INTRAVENOUS

## 2024-03-15 MED ORDER — SODIUM CHLORIDE 0.9 % IV SOLN
1.0000 g | Freq: Once | INTRAVENOUS | Status: AC
  Administered 2024-03-15: 1 g via INTRAVENOUS
  Filled 2024-03-15: qty 10

## 2024-03-15 NOTE — ED Provider Notes (Signed)
 [facility] Provider Note   Event Date/Time   First MD Initiated Contact with Patient 03/15/24 1457     (approximate) History  Hematuria (PT to ER from Home for C/O blood in his urine and altered mental status for approximately 45 minutes to an hour)  HPI Willie Mcdonald is a 89 y.o. male with a past medical history of severe dementia, nonambulatory, hyperlipidemia, and GERD who presents from home via EMS with complaints of blood in his urine as well as altered mental status.  Family states that patient had a large bowel movement with the home health nurse noticing a large amount of blood in the toilet.  It is unclear as to whether this bleeding was coming from patient's GI tract versus the penis.  This altered mental status was characterized as patient being presyncopal throughout that time however patient is back to baseline per family.  Per family, patient has had any complaints over the last few days. ROS: unable to assess   Physical Exam  Triage Vital Signs: ED Triage Vitals [03/15/24 1420]  Encounter Vitals Group     BP 130/80     Girls Systolic BP Percentile      Girls Diastolic BP Percentile      Boys Systolic BP Percentile      Boys Diastolic BP Percentile      Pulse Rate 86     Resp 20     Temp 97.9 F (36.6 C)     Temp src      SpO2 100 %     Weight 180 lb 12.4 oz (82 kg)     Height      Head Circumference      Peak Flow      Pain Score      Pain Loc      Pain Education      Exclude from Growth Chart    Most recent vital signs: Vitals:   03/15/24 1425 03/15/24 1430  BP: 130/80 116/73  Pulse: 86 95  Resp:  (!) 28  Temp:    SpO2: 99% 100%   General: Awake, cooperative CV:  Good peripheral perfusion. Resp:  Normal effort. Abd:  No distention.  Nontender to palpation Other:  Elderly overweight Caucasian male resting comfortably in no acute distress ED Results / Procedures / Treatments  Labs (all labs ordered are listed, but only  abnormal results are displayed) Labs Reviewed  COMPREHENSIVE METABOLIC PANEL WITH GFR - Abnormal; Notable for the following components:      Result Value   Glucose, Bld 175 (*)    BUN 24 (*)    GFR, Estimated 60 (*)    All other components within normal limits  CBC WITH DIFFERENTIAL/PLATELET - Abnormal; Notable for the following components:   WBC 17.5 (*)    Neutro Abs 14.8 (*)    Abs Immature Granulocytes 0.08 (*)    All other components within normal limits  URINALYSIS, W/ REFLEX TO CULTURE (INFECTION SUSPECTED)   EKG ED ECG REPORT I, Artist MARLA Kerns, the attending physician, personally viewed and interpreted this ECG. Date: 03/15/2024 EKG Time: 1428 Rate: 88 Rhythm: normal sinus rhythm QRS Axis: normal Intervals: normal ST/T Wave abnormalities: normal Narrative Interpretation: no evidence of acute ischemia PROCEDURES: Critical Care performed: No Procedures MEDICATIONS ORDERED IN ED: Medications - No data to display IMPRESSION / MDM / ASSESSMENT AND PLAN / ED COURSE  I reviewed the triage vital signs and the nursing notes.  The patient is on the cardiac monitor to evaluate for evidence of arrhythmia and/or significant heart rate changes. Patient's presentation is most consistent with acute presentation with potential threat to life or bodily function. Patient is a 89 year old male with the above-stated past medical history who presents complaining of hematuria that began just prior to arrival with associated presyncopal symptoms. DDx: Urinary tract infection, kidney stone, intravesicular bleeding, glomerulonephritis Plan: CBC, CMP, UA  Based on patient's physical exam and laboratory evaluation, believe patient has a urinary tract infection causing this hematuria at this time.  Patient shows no significant pain that is concerning for kidney stone.  Patient shows large leukocytes and rare bacteria on UA as well as hemoglobin.  Will treat empirically  with third-generation cephalosporin with first dose here in emergency department.  Patient's caregivers at bedside agree with plan for discharge at this time with outpatient Ceftin  and follow-up with their PCP, Dr. Auston, if symptoms do not improve.  Caregivers given strict return precautions and all questions answered prior to discharge  Dispo: Discharge home with PCP follow-up   FINAL CLINICAL IMPRESSION(S) / ED DIAGNOSES   Final diagnoses:  None   Rx / DC Orders   ED Discharge Orders     None      Note:  This document was prepared using Dragon voice recognition software and may include unintentional dictation errors.   Jossie Artist POUR, MD 03/15/24 [id number]

## 2024-03-17 LAB — URINE CULTURE: Culture: 70000 — AB

## 2024-03-21 ENCOUNTER — Emergency Department

## 2024-03-21 ENCOUNTER — Observation Stay: Admission: EM | Admit: 2024-03-21 | Discharge: 2024-03-22 | Disposition: A | Attending: Student | Admitting: Student

## 2024-03-21 ENCOUNTER — Encounter: Payer: Self-pay | Admitting: Family Medicine

## 2024-03-21 ENCOUNTER — Other Ambulatory Visit: Payer: Self-pay

## 2024-03-21 DIAGNOSIS — N400 Enlarged prostate without lower urinary tract symptoms: Secondary | ICD-10-CM | POA: Diagnosis not present

## 2024-03-21 DIAGNOSIS — Z79899 Other long term (current) drug therapy: Secondary | ICD-10-CM | POA: Insufficient documentation

## 2024-03-21 DIAGNOSIS — I6523 Occlusion and stenosis of bilateral carotid arteries: Secondary | ICD-10-CM | POA: Diagnosis not present

## 2024-03-21 DIAGNOSIS — Z7982 Long term (current) use of aspirin: Secondary | ICD-10-CM | POA: Insufficient documentation

## 2024-03-21 DIAGNOSIS — R4182 Altered mental status, unspecified: Secondary | ICD-10-CM

## 2024-03-21 DIAGNOSIS — W06XXXA Fall from bed, initial encounter: Secondary | ICD-10-CM | POA: Insufficient documentation

## 2024-03-21 DIAGNOSIS — R55 Syncope and collapse: Secondary | ICD-10-CM | POA: Diagnosis present

## 2024-03-21 DIAGNOSIS — M25512 Pain in left shoulder: Secondary | ICD-10-CM | POA: Diagnosis not present

## 2024-03-21 DIAGNOSIS — I5A Non-ischemic myocardial injury (non-traumatic): Secondary | ICD-10-CM | POA: Insufficient documentation

## 2024-03-21 DIAGNOSIS — Z87891 Personal history of nicotine dependence: Secondary | ICD-10-CM | POA: Diagnosis not present

## 2024-03-21 DIAGNOSIS — D72829 Elevated white blood cell count, unspecified: Secondary | ICD-10-CM | POA: Insufficient documentation

## 2024-03-21 DIAGNOSIS — G3183 Dementia with Lewy bodies: Secondary | ICD-10-CM | POA: Insufficient documentation

## 2024-03-21 DIAGNOSIS — S42032A Displaced fracture of lateral end of left clavicle, initial encounter for closed fracture: Secondary | ICD-10-CM | POA: Insufficient documentation

## 2024-03-21 DIAGNOSIS — Z79890 Hormone replacement therapy (postmenopausal): Secondary | ICD-10-CM | POA: Insufficient documentation

## 2024-03-21 DIAGNOSIS — Z87440 Personal history of urinary (tract) infections: Secondary | ICD-10-CM | POA: Insufficient documentation

## 2024-03-21 LAB — CBC WITH DIFFERENTIAL/PLATELET
Abs Immature Granulocytes: 0.04 K/uL (ref 0.00–0.07)
Basophils Absolute: 0.1 K/uL (ref 0.0–0.1)
Basophils Relative: 0 %
Eosinophils Absolute: 0.2 K/uL (ref 0.0–0.5)
Eosinophils Relative: 2 %
HCT: 36.7 % — ABNORMAL LOW (ref 39.0–52.0)
Hemoglobin: 12.7 g/dL — ABNORMAL LOW (ref 13.0–17.0)
Immature Granulocytes: 0 %
Lymphocytes Relative: 16 %
Lymphs Abs: 2 K/uL (ref 0.7–4.0)
MCH: 32.2 pg (ref 26.0–34.0)
MCHC: 34.6 g/dL (ref 30.0–36.0)
MCV: 93.1 fL (ref 80.0–100.0)
Monocytes Absolute: 0.8 K/uL (ref 0.1–1.0)
Monocytes Relative: 7 %
Neutro Abs: 9.2 K/uL — ABNORMAL HIGH (ref 1.7–7.7)
Neutrophils Relative %: 75 %
Platelets: 325 K/uL (ref 150–400)
RBC: 3.94 MIL/uL — ABNORMAL LOW (ref 4.22–5.81)
RDW: 12.3 % (ref 11.5–15.5)
WBC: 12.3 K/uL — ABNORMAL HIGH (ref 4.0–10.5)
nRBC: 0 % (ref 0.0–0.2)

## 2024-03-21 LAB — COMPREHENSIVE METABOLIC PANEL WITH GFR
ALT: 6 U/L (ref 0–44)
AST: 17 U/L (ref 15–41)
Albumin: 3.5 g/dL (ref 3.5–5.0)
Alkaline Phosphatase: 98 U/L (ref 38–126)
Anion gap: 12 (ref 5–15)
BUN: 16 mg/dL (ref 8–23)
CO2: 25 mmol/L (ref 22–32)
Calcium: 9.1 mg/dL (ref 8.9–10.3)
Chloride: 102 mmol/L (ref 98–111)
Creatinine, Ser: 0.96 mg/dL (ref 0.61–1.24)
GFR, Estimated: >60 mL/min (ref >60)
Glucose, Bld: 120 mg/dL — ABNORMAL HIGH (ref 70–99)
Potassium: 3.6 mmol/L (ref 3.5–5.1)
Sodium: 139 mmol/L (ref 135–145)
Total Bilirubin: 0.4 mg/dL (ref 0.0–1.2)
Total Protein: 6.3 g/dL — ABNORMAL LOW (ref 6.5–8.1)

## 2024-03-21 LAB — TROPONIN T, HIGH SENSITIVITY: Troponin T High Sensitivity: 37 ng/L — ABNORMAL HIGH (ref 0–19)

## 2024-03-21 MED ORDER — PANTOPRAZOLE SODIUM 40 MG PO TBEC
40.0000 mg | DELAYED_RELEASE_TABLET | Freq: Every day | ORAL | Status: DC
  Administered 2024-03-22: 40 mg via ORAL
  Filled 2024-03-21: qty 1

## 2024-03-21 MED ORDER — LEVOTHYROXINE SODIUM 50 MCG PO TABS: 25.0000 ug | ORAL_TABLET | Freq: Every day | ORAL | Status: DC

## 2024-03-21 MED ORDER — DULOXETINE HCL 20 MG PO CPEP
20.0000 mg | ORAL_CAPSULE | Freq: Every day | ORAL | Status: DC
  Administered 2024-03-22: 20 mg via ORAL
  Filled 2024-03-21: qty 1

## 2024-03-21 MED ORDER — TRAZODONE HCL 50 MG PO TABS: 25.0000 mg | ORAL_TABLET | Freq: Every evening | ORAL | Status: DC | PRN

## 2024-03-21 MED ORDER — CARBIDOPA-LEVODOPA 25-100 MG PO TABS: 1.0000 | ORAL_TABLET | Freq: Every day | ORAL | Status: DC

## 2024-03-21 MED ORDER — SODIUM CHLORIDE 0.9 % IV SOLN: INTRAVENOUS | Status: DC

## 2024-03-21 MED ORDER — MEMANTINE HCL 5 MG PO TABS
5.0000 mg | ORAL_TABLET | Freq: Two times a day (BID) | ORAL | Status: DC
  Administered 2024-03-22: 5 mg via ORAL
  Filled 2024-03-21: qty 1

## 2024-03-21 MED ORDER — ONDANSETRON HCL 4 MG/2ML IJ SOLN: 4.0000 mg | Freq: Four times a day (QID) | INTRAMUSCULAR | Status: DC | PRN

## 2024-03-21 MED ORDER — IRBESARTAN 75 MG PO TABS: 75.0000 mg | ORAL_TABLET | Freq: Every day | ORAL | Status: DC

## 2024-03-21 MED ORDER — METAXALONE 800 MG PO TABS
800.0000 mg | ORAL_TABLET | Freq: Two times a day (BID) | ORAL | Status: DC
  Filled 2024-03-21: qty 1

## 2024-03-21 MED ORDER — FINASTERIDE 5 MG PO TABS
5.0000 mg | ORAL_TABLET | Freq: Every day | ORAL | Status: DC
  Administered 2024-03-22: 5 mg via ORAL
  Filled 2024-03-21: qty 1

## 2024-03-21 MED ORDER — MAGNESIUM HYDROXIDE 400 MG/5ML PO SUSP: 30.0000 mL | Freq: Every day | ORAL | Status: DC | PRN

## 2024-03-21 MED ORDER — CYANOCOBALAMIN 500 MCG PO TABS
1000.0000 ug | ORAL_TABLET | Freq: Every day | ORAL | Status: DC
  Administered 2024-03-22: 1000 ug via ORAL
  Filled 2024-03-21: qty 2

## 2024-03-21 MED ORDER — ACETAMINOPHEN 650 MG RE SUPP: 650.0000 mg | Freq: Four times a day (QID) | RECTAL | Status: DC | PRN

## 2024-03-21 MED ORDER — MORPHINE SULFATE (PF) 2 MG/ML IV SOLN
2.0000 mg | Freq: Once | INTRAVENOUS | Status: AC
  Administered 2024-03-21: 2 mg via INTRAVENOUS
  Filled 2024-03-21: qty 1

## 2024-03-21 MED ORDER — TAMSULOSIN HCL 0.4 MG PO CAPS: 0.4000 mg | ORAL_CAPSULE | Freq: Every day | ORAL | Status: DC

## 2024-03-21 MED ORDER — ONDANSETRON HCL 4 MG PO TABS: 4.0000 mg | ORAL_TABLET | Freq: Four times a day (QID) | ORAL | Status: DC | PRN

## 2024-03-21 MED ORDER — ENOXAPARIN SODIUM 40 MG/0.4ML IJ SOSY: 40.0000 mg | PREFILLED_SYRINGE | Freq: Every day | INTRAMUSCULAR | Status: DC

## 2024-03-21 MED ORDER — ASPIRIN 81 MG PO TBEC
81.0000 mg | DELAYED_RELEASE_TABLET | Freq: Every day | ORAL | Status: DC
  Administered 2024-03-22: 81 mg via ORAL
  Filled 2024-03-21: qty 1

## 2024-03-21 MED ORDER — LIDOCAINE 5 % EX PTCH: 1.0000 | MEDICATED_PATCH | CUTANEOUS | Status: DC

## 2024-03-21 MED ORDER — MODAFINIL 100 MG PO TABS
100.0000 mg | ORAL_TABLET | Freq: Every day | ORAL | Status: DC
  Administered 2024-03-22: 100 mg via ORAL
  Filled 2024-03-21: qty 1

## 2024-03-21 MED ORDER — ACETAMINOPHEN 325 MG PO TABS: 650.0000 mg | ORAL_TABLET | Freq: Four times a day (QID) | ORAL | Status: DC | PRN

## 2024-03-21 MED ORDER — DONEPEZIL HCL 5 MG PO TABS: 10.0000 mg | ORAL_TABLET | Freq: Every day | ORAL | Status: DC

## 2024-03-21 MED ORDER — LACTATED RINGERS IV BOLUS
1000.0000 mL | Freq: Once | INTRAVENOUS | Status: AC
  Administered 2024-03-21: 1000 mL via INTRAVENOUS

## 2024-03-21 MED ORDER — VITAMIN D3 25 MCG (1000 UNIT) PO TABS
1000.0000 [IU] | ORAL_TABLET | Freq: Every day | ORAL | Status: DC
  Administered 2024-03-22: 1000 [IU] via ORAL
  Filled 2024-03-21 (×2): qty 1

## 2024-03-21 NOTE — H&P (Signed)
 [place]   PATIENT NAME: Willie Mcdonald    MR#:  [id number]  DATE OF BIRTH:  18-Willie Mcdonald-1936  DATE OF ADMISSION:  03/21/2024  PRIMARY CARE PHYSICIAN: Auston Reyes BIRCH, MD   Patient is coming from: Home  REQUESTING/REFERRING PHYSICIAN: Willo Dunnings, MD  CHIEF COMPLAINT:   Chief Complaint  Patient presents with   Loss of Consciousness    HISTORY OF PRESENT ILLNESS:  Willie Mcdonald is a 89 y.o. Caucasian male with medical history significant for dyslipidemia and GERD, as well as [name] dementia, who presented to the emergency room with acute onset of recurrent syncope.  The patient had another syncopal episode about a week ago.  Tonight he was sitting on his toilet and having a bowel movement before he lost consciousness for a few minutes per his wife.  He did not have any chest pain or palpitations.  No paresthesias or focal muscle weakness.  No tinnitus or vertigo.  No cough or wheezing or hemoptysis.  He had left shoulder pain after his last fall.  ED Course: When the patient came to the ER, vital signs were within normal.  Labs revealed total protein 6.3 with otherwise unremarkable CMP.  CBC showed leukocytosis 12.3 and hemoglobin 12.7 hematocrit 36.7.  High-sensitivity troponin I was 37 and later 31. EKG as reviewed by me : EKG showed normal sinus rhythm with with a rate of 79 with low voltage QRS. Imaging: Noncontrasted head CT scan showed moderate cerebral atrophy with atrophic ventriculomegaly and mild small vessel changes with no acute intracranial abnormalities.  C-spine without contrast revealed degenerative changes of the cervical spine with no acute osseous abnormality.  The patient was given 1 L bolus of IV lactated ringer  and 2 mg of IV morphine  sulfate.  He will be admitted to an observation medical telemetry bed for further evaluation and management. PAST MEDICAL HISTORY:   Past Medical History:  Diagnosis Date   GERD (gastroesophageal reflux disease)     Hyperlipidemia    Wears hearing aid in both ears   -[name] dementia  PAST SURGICAL HISTORY:   Past Surgical History:  Procedure Laterality Date   CATARACT EXTRACTION W/PHACO Left 05/22/2018   Procedure: CATARACT EXTRACTION PHACO AND INTRAOCULAR LENS PLACEMENT (IOC)  COMPLICATED LEFT TORIC LENS;  Surgeon: Mittie Gaskin, MD;  Location[facility];  Service: Ophthalmology;  Laterality: Left;  MALYUGIN   CATARACT EXTRACTION W/PHACO Right 06/12/2018   Procedure: CATARACT EXTRACTION PHACO AND INTRAOCULAR LENS PLACEMENT (IOC)  RIGHT TORIC LENS;  Surgeon: Mittie Gaskin, MD;  Location: [facility];  Service: Ophthalmology;  Laterality: Right;   TONSILLECTOMY      SOCIAL HISTORY:   Social History   Tobacco Use   Smoking status: Never   Smokeless tobacco: Former  Substance Use Topics   Alcohol use: Not Currently    FAMILY HISTORY:  No pertinent family diseases.  DRUG ALLERGIES:  Allergies[1]  REVIEW OF SYSTEMS:   ROS As per history of present illness. All pertinent systems were reviewed above. Constitutional, HEENT, cardiovascular, respiratory, GI, GU, musculoskeletal, neuro, psychiatric, endocrine, integumentary and hematologic systems were reviewed and are otherwise negative/unremarkable except for positive findings mentioned above in the HPI.   MEDICATIONS AT HOME:   Prior to Admission medications  Medication Sig Start Date End Date Taking? Authorizing Provider  aspirin  EC 81 MG tablet Take 81 mg by mouth daily.    [provider]  carbidopa -levodopa  (SINEMET  IR) 25-100 MG tablet Take 1 tablet  by mouth at bedtime.    [provider]  cefUROXime  (CEFTIN ) 250 MG tablet Take 1 tablet (250 mg total) by mouth 2 (two) times daily with a meal. 03/15/24   Bradler, Evan K, MD  Cholecalciferol  (VITAMIN D3) 10 MCG (400 UNIT) tablet Take 800 Units by mouth.    [provider]  cholecalciferol  (VITAMIN D3) 10 MCG (400 UNIT) TABS tablet  Take 1,000 Units by mouth daily.    [provider]  cyanocobalamin  1000 MCG tablet Take 1,000 mcg by mouth daily.    [provider]  donepezil  (ARICEPT ) 10 MG tablet Take 10 mg by mouth at bedtime.    [provider]  DULoxetine  (CYMBALTA ) 20 MG capsule Take 20 mg by mouth daily. 11/18/23   [provider]  finasteride  (PROSCAR ) 5 MG tablet Take 5 mg by mouth daily.    [provider]  levothyroxine  (SYNTHROID ) 25 MCG tablet Take 25 mcg by mouth every morning.    [provider]  lidocaine  (LIDODERM ) 5 % Place 1 patch onto the skin daily. Remove & Discard patch within 12 hours or as directed by MD 02/23/24 03/24/24  Waymond Lorelle Cummins, MD  memantine  (NAMENDA ) 5 MG tablet Take 5 mg by mouth 2 (two) times daily.    [provider]  metaxalone  (SKELAXIN ) 800 MG tablet Take 800 mg by mouth 2 (two) times daily. 03/09/24   [provider]  modafinil  (PROVIGIL ) 100 MG tablet Take 100 mg by mouth. 10/18/23 10/17/24  [provider]  omeprazole (PRILOSEC) 40 MG capsule Take 40 mg by mouth daily.    [provider]  pravastatin (PRAVACHOL) 20 MG tablet Take 20 mg by mouth daily. Patient not taking: Reported on 02/02/2022    [provider]  sildenafil (REVATIO) 20 MG tablet Take 20 mg by mouth 3 (three) times daily.    [provider]  tamsulosin  (FLOMAX ) 0.4 MG CAPS capsule Take 0.4 mg by mouth.    [provider]  telmisartan (MICARDIS) 80 MG tablet Take 80 mg by mouth daily.    [provider]  tiZANidine (ZANAFLEX) 4 MG capsule Take 4 mg by mouth 3 (three) times daily. Patient not taking: Reported on 02/02/2022    [provider]      VITAL SIGNS:  Blood pressure 110/81, pulse 78, temperature 97.8 F (36.6 C), resp. rate 18, weight 78.2 kg, SpO2 97%.  PHYSICAL EXAMINATION:  Physical Exam  GENERAL:  89 y.o.-year-old Caucasian male patient lying in the bed with no acute  distress.  EYES: Pupils equal, round, reactive to light and accommodation. No scleral icterus. Extraocular muscles intact.  HEENT: Head atraumatic, normocephalic. Oropharynx and nasopharynx clear.  NECK:  Supple, no jugular venous distention. No thyroid enlargement, no tenderness.  LUNGS: Normal breath sounds bilaterally, no wheezing, rales,rhonchi or crepitation. No use of accessory muscles of respiration.  CARDIOVASCULAR: Regular rate and rhythm, S1, S2 normal. No murmurs, rubs, or gallops.  ABDOMEN: Soft, nondistended, nontender. Bowel sounds present. No organomegaly or mass.  EXTREMITIES: No pedal edema, cyanosis, or clubbing. Musculoskeletal: Left upper extremity is in left shoulder sling. NEUROLOGIC: Cranial nerves II through XII are intact. Muscle strength 5/5 in all extremities. Sensation intact. Gait not checked.  PSYCHIATRIC: The patient is alert and oriented x 3.  Normal affect and good eye contact. SKIN: No obvious rash, lesion, or ulcer.   LABORATORY PANEL:   CBC Recent Labs  Lab 03/21/24 1915  WBC 12.3*  HGB 12.7*  HCT  36.7*  PLT 325   ------------------------------------------------------------------------------------------------------------------  Chemistries  Recent Labs  Lab 03/21/24 1915  NA 139  K 3.6  CL 102  CO2 25  GLUCOSE 120*  BUN 16  CREATININE 0.96  CALCIUM 9.1  AST 17  ALT 6  ALKPHOS 98  BILITOT 0.4   ------------------------------------------------------------------------------------------------------------------  Cardiac Enzymes No results for input(s): TROPONINI in the last 168 hours. ------------------------------------------------------------------------------------------------------------------  RADIOLOGY:  CT Cervical Spine Wo Contrast Result Date: 03/21/2024 CLINICAL DATA:  Neck trauma syncope EXAM: CT CERVICAL SPINE WITHOUT CONTRAST TECHNIQUE: Multidetector CT imaging of the cervical spine was performed without intravenous  contrast. Multiplanar CT image reconstructions were also generated. RADIATION DOSE REDUCTION: This exam was performed according to the departmental dose-optimization program which includes automated exposure control, adjustment of the mA and/or kV according to patient size and/or use of iterative reconstruction technique. COMPARISON:  None Available. FINDINGS: Alignment: No subluxation.  Facet alignment is normal Skull base and vertebrae: No acute fracture. No primary bone lesion or focal pathologic process. Soft tissues and spinal canal: No prevertebral fluid or swelling. No visible canal hematoma. Disc levels: Multilevel degenerative changes. Moderate severe diffuse disc space narrowing C4 through T1. Multilevel facet degenerative change with foraminal narrowing. No high-grade bony canal stenosis Upper chest: Negative. Other: None IMPRESSION: Degenerative changes of the cervical spine. No acute osseous abnormality. Electronically Signed   By: Luke Bun M.D.   On: 03/21/2024 20:49   DG Shoulder Left Result Date: 03/21/2024 CLINICAL DATA:  Fall with shoulder pain EXAM: LEFT SHOULDER - 2+ VIEW COMPARISON:  None Available. FINDINGS: Acute mildly displaced fracture involving the distal end of left clavicle. AC joint does not appear widened. Mild AC joint and glenohumeral degenerative change. Left apex is clear IMPRESSION: Acute mildly displaced distal left clavicle fracture. Electronically Signed   By: Luke Bun M.D.   On: 03/21/2024 20:45   CT Head Wo Contrast Result Date: 03/21/2024 EXAM: CT HEAD WITHOUT 03/21/2024 07:59:56 PM TECHNIQUE: CT of the head was performed without the administration of intravenous contrast. Automated exposure control, iterative reconstruction, and/or weight based adjustment of the mA/kV was utilized to reduce the radiation dose to as low as reasonably achievable. COMPARISON: Head CT 05/19/2022. CLINICAL HISTORY: Mental status change, unknown cause. History of the patient  having a syncopal episode on the toilet, followed by vomiting, with history of advanced dementia. FINDINGS: BRAIN AND VENTRICLES: No acute intracranial hemorrhage. No mass effect or midline shift. No extra-axial fluid collection. No evidence of acute infarct. There is moderate cerebral atrophy with atrophic ventriculomegaly and relatively mild small vessel disease in the cerebral white matter. There is slight cerebellar volume loss. There is calcification in the siphons and distal vertebral arteries. No hyperdense vessel identified. The basal cisterns are clear. ORBITS: No acute orbital findings. There are old lens replacements. 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 SOFT TISSUES AND SKULL: No acute skull fracture. No acute soft tissue abnormality. IMPRESSION: 1. No acute intracranial CT findings. 2. Moderate cerebral atrophy with atrophic ventriculomegaly. Mild small vessel changes. Stable exam. Electronically signed by: Francis Quam MD 03/21/2024 08:09 PM EST RP Workstation: [id number]      IMPRESSION AND PLAN:  Assessment and Plan: * Recurrent syncope - The patient will be admitted to an observation medically monitored bed. - Will check orthostatics q 12 hours. - Will obtain a bilateral carotid Doppler and 2D echo. - The patient will be gently hydrated with  IV normal saline and monitored for arrhythmias. -Differential diagnoses would include neurally mediated syncope, cardiogenic, arrhythmias related,  orthostatic hypotension and less likely hypoglycemia.    Essential hypertension - Will continue antihypertensive therapy.  [name] dementia (HCC) - Continue Aricept  and Namenda . - Will continue Sinemet  IR for likely associated parkinsonism.  Hypothyroidism - Will continue Synthroid .  GERD without esophagitis - Will continue PPI therapy.  BPH (benign prostatic hyperplasia) - Will continue  Flomax .   DVT prophylaxis: Lovenox .  Advanced Care Planning:  Code Status: full code.  Family Communication:  The plan of care was discussed in details with the patient (and family). I answered all questions. The patient agreed to proceed with the above mentioned plan. Further management will depend upon hospital course. Disposition Plan: Back to previous home environment Consults called: none.  All the records are reviewed and case discussed with ED provider.  Status is: Observation  I certify that at the time of admission, it is my clinical judgment that the patient will require inpatient hospital care extending more than 2 midnights.                            Dispo: The patient is from: Home              Anticipated d/c is to: Home              Patient currently is not medically stable to d/c.              Difficult to place patient: No  Madison DELENA Peaches M.D on 03/22/2024 at 12:34 AM  [facility]   From 7 PM-7 AM, contact night-coverage [email or web address]  CC: Primary care physician; Auston Reyes BIRCH, MD     [1] No Known Allergies

## 2024-03-21 NOTE — ED Triage Notes (Signed)
 On toilet Syncopal episode. Family reports eyes rolled back and LOC. When patient started to resume baseline had a episode of vomiting. Did not hit head per EMS/Family. Patient has advanced dementia. 117/90 with 200 ml bolus 132/67.O2 95-97 HR 77. CBG 132. SR on tele. EMS reported patient had this episode a week ago with no ER eval.

## 2024-03-21 NOTE — ED Provider Notes (Signed)
 [facility] Provider Note    Event Date/Time   First MD Initiated Contact with Patient 03/21/24 1903     (approximate)   History   Chief Complaint Loss of Consciousness   HPI  Willie Mcdonald is a 89 y.o. male with past medical history of [name] dementia, hyperlipidemia, and GERD who presents to the ED complaining of syncope.  Wife at bedside reports that patient was struggling to have a bowel movement this evening when he suddenly lost consciousness, but did not fall off of the toilet.  Patient is unable to provide any significant history at this time and family reports that he has been less responsive than usual since the episode.  He did have a similar syncopal episode 1 week ago, was seen in the ED at that time with unremarkable workup.  Patient is unable to state whether he had any chest pain or shortness of breath today, but currently states he feels fine.  Family does state that he also had a fall out of bed yesterday onto his left shoulder, where they have seen a significant amount of bruising.     Physical Exam   Triage Vital Signs: ED Triage Vitals  Encounter Vitals Group     BP 03/21/24 1910 129/73     Girls Systolic BP Percentile --      Girls Diastolic BP Percentile --      Boys Systolic BP Percentile --      Boys Diastolic BP Percentile --      Pulse Rate 03/21/24 1910 99     Resp 03/21/24 1910 16     Temp 03/21/24 1914 97.8 F (36.6 C)     Temp src --      SpO2 03/21/24 1909 100 %     Weight 03/21/24 1911 172 lb 6.4 oz (78.2 kg)     Height --      Head Circumference --      Peak Flow --      Pain Score --      Pain Loc --      Pain Education --      Exclude from Growth Chart --     Most recent vital signs: Vitals:   03/21/24 1910 03/21/24 1914  BP: 129/73   Pulse: 99   Resp: 16   Temp:  97.8 F (36.6 C)  SpO2: 100%     Constitutional: Somnolent but arousable to voice, oriented only to person. Eyes: Conjunctivae  are normal. Head: Atraumatic. Nose: No congestion/rhinnorhea. Mouth/Throat: Mucous membranes are moist.  Neck: No midline cervical spine tenderness to palpation. Cardiovascular: Normal rate, regular rhythm. Grossly normal heart sounds.  2+ radial pulses bilaterally. Respiratory: Normal respiratory effort.  No retractions. Lungs CTAB. Gastrointestinal: Soft and nontender. No distention. Musculoskeletal: No lower extremity tenderness nor edema.  Left shoulder ecchymosis with diffuse tenderness but no deformity. Neurologic:  Normal speech and language. No gross focal neurologic deficits are appreciated.    ED Results / Procedures / Treatments   Labs (all labs ordered are listed, but only abnormal results are displayed) Labs Reviewed  CBC WITH DIFFERENTIAL/PLATELET - Abnormal; Notable for the following components:      Result Value   WBC 12.3 (*)    RBC 3.94 (*)    Hemoglobin 12.7 (*)    HCT 36.7 (*)    Neutro Abs 9.2 (*)    All other components within normal limits  COMPREHENSIVE METABOLIC PANEL WITH GFR - Abnormal; Notable  for the following components:   Glucose, Bld 120 (*)    Total Protein 6.3 (*)    All other components within normal limits  TROPONIN T, HIGH SENSITIVITY - Abnormal; Notable for the following components:   Troponin T High Sensitivity 37 (*)    All other components within normal limits  TROPONIN T, HIGH SENSITIVITY     EKG  ED ECG REPORT I, Carlin Palin, the attending physician, personally viewed and interpreted this ECG.   Date: 03/21/2024  EKG Time: 19:11  Rate: 79  Rhythm: normal sinus rhythm  Axis: Normal  Intervals:none  ST&T Change: None  RADIOLOGY CT head reviewed and interpreted by me with no hemorrhage or midline shift.  PROCEDURES:  Critical Care performed: No  Procedures   MEDICATIONS ORDERED IN ED: Medications  lactated ringers  bolus 1,000 mL (has no administration in time range)  morphine (PF) 2 MG/ML injection 2 mg (has no  administration in time range)     IMPRESSION / MDM / ASSESSMENT AND PLAN / ED COURSE  I reviewed the triage vital signs and the nursing notes.                              89 y.o. male with past medical history of [name] dementia, hyperlipidemia, and GERD who presents to the ED following syncopal episode on the toilet this evening with decreased responsiveness since then.  Patient's presentation is most consistent with acute presentation with potential threat to life or bodily function.  Differential diagnosis includes, but is not limited to, arrhythmia, ACS, vasovagal episode, orthostatic hypotension, SAH, stroke, TIA, anemia, electrolyte abnormality, AKI.  Patient nontoxic-appearing and in no acute distress, vital signs are unremarkable.  He is somnolent but easily arousable, does seem less responsive than his described baseline but without any focal neurologic deficits.  CT head was performed and negative for acute finding, family now stating that patient had a fall yesterday, will check x-rays of left shoulder and CT of cervical spine.  Labs without significant anemia, leukocytosis, electrolyte abnormality, or AKI.  Troponin mildly elevated but similar to prior baseline.  Plan to hydrate with IV fluids, observe on cardiac monitor, and reassess.  CT cervical spine is negative for acute process, x-ray of the left shoulder shows fracture of the distal clavicle and we will place patient in sling.  He does appear to be in pain on reassessment, will treat with IV morphine.  Case discussed with hospitalist for admission for further syncope and altered mental status workup.      FINAL CLINICAL IMPRESSION(S) / ED DIAGNOSES   Final diagnoses:  Syncope, unspecified syncope type  Altered mental status, unspecified altered mental status type  Closed displaced fracture of acromial end of left clavicle, initial encounter     Rx / DC Orders   ED Discharge Orders     None        Note:   This document was prepared using Dragon voice recognition software and may include unintentional dictation errors.   Palin Carlin, MD 03/21/24 2115

## 2024-03-22 ENCOUNTER — Observation Stay
Admit: 2024-03-22 | Discharge: 2024-03-22 | Disposition: A | Discharge status: Home / Self Care | Attending: Family Medicine | Admitting: Family Medicine

## 2024-03-22 ENCOUNTER — Observation Stay

## 2024-03-22 DIAGNOSIS — K219 Gastro-esophageal reflux disease without esophagitis: Secondary | ICD-10-CM

## 2024-03-22 DIAGNOSIS — E039 Hypothyroidism, unspecified: Secondary | ICD-10-CM

## 2024-03-22 DIAGNOSIS — R55 Syncope and collapse: Secondary | ICD-10-CM | POA: Diagnosis not present

## 2024-03-22 DIAGNOSIS — I10 Essential (primary) hypertension: Secondary | ICD-10-CM

## 2024-03-22 DIAGNOSIS — F0280 Dementia in other diseases classified elsewhere without behavioral disturbance: Secondary | ICD-10-CM

## 2024-03-22 DIAGNOSIS — N400 Enlarged prostate without lower urinary tract symptoms: Secondary | ICD-10-CM | POA: Insufficient documentation

## 2024-03-22 LAB — CBC
HCT: 34 % — ABNORMAL LOW (ref 39.0–52.0)
Hemoglobin: 11.6 g/dL — ABNORMAL LOW (ref 13.0–17.0)
MCH: 31.5 pg (ref 26.0–34.0)
MCHC: 34.1 g/dL (ref 30.0–36.0)
MCV: 92.4 fL (ref 80.0–100.0)
Platelets: 274 K/uL (ref 150–400)
RBC: 3.68 MIL/uL — ABNORMAL LOW (ref 4.22–5.81)
RDW: 12.5 % (ref 11.5–15.5)
WBC: 10.4 K/uL (ref 4.0–10.5)
nRBC: 0 % (ref 0.0–0.2)

## 2024-03-22 LAB — IRON AND TIBC
Iron: 52 ug/dL (ref 45–182)
Saturation Ratios: 23 % (ref 17.9–39.5)
TIBC: 224 ug/dL — ABNORMAL LOW (ref 250–450)
UIBC: 172 ug/dL

## 2024-03-22 LAB — BASIC METABOLIC PANEL WITH GFR
Anion gap: 9 (ref 5–15)
BUN: 15 mg/dL (ref 8–23)
CO2: 23 mmol/L (ref 22–32)
Calcium: 9 mg/dL (ref 8.9–10.3)
Chloride: 107 mmol/L (ref 98–111)
Creatinine, Ser: 0.76 mg/dL (ref 0.61–1.24)
GFR, Estimated: >60 mL/min (ref >60)
Glucose, Bld: 101 mg/dL — ABNORMAL HIGH (ref 70–99)
Potassium: 3.4 mmol/L — ABNORMAL LOW (ref 3.5–5.1)
Sodium: 139 mmol/L (ref 135–145)

## 2024-03-22 LAB — TROPONIN T, HIGH SENSITIVITY: Troponin T High Sensitivity: 31 ng/L — ABNORMAL HIGH (ref 0–19)

## 2024-03-22 LAB — ECHOCARDIOGRAM COMPLETE: Weight: 2758.4 [oz_av]

## 2024-03-22 LAB — FERRITIN: Ferritin: 218 ng/mL (ref 24–336)

## 2024-03-22 MED ORDER — POLYETHYLENE GLYCOL 3350 17 G PO PACK
17.0000 g | PACK | Freq: Every day | ORAL | Status: DC
  Administered 2024-03-22: 17 g via ORAL
  Filled 2024-03-22: qty 1

## 2024-03-22 MED ORDER — POLYETHYLENE GLYCOL 3350 17 G PO PACK: 17.0000 g | PACK | Freq: Every day | ORAL | Status: AC

## 2024-03-22 MED ORDER — POTASSIUM CHLORIDE 20 MEQ PO PACK
40.0000 meq | PACK | Freq: Once | ORAL | Status: AC
  Administered 2024-03-22: 40 meq via ORAL
  Filled 2024-03-22: qty 2

## 2024-03-22 NOTE — Assessment & Plan Note (Signed)
-   Will continue Synthroid .

## 2024-03-22 NOTE — Evaluation (Signed)
 Speech Language Pathology Evaluation Patient Details Name: Willie Mcdonald MRN: [id number] DOB: 09-Aug-1934 Today's Date: 03/22/2024 Time: 1030-1055 SLP Time Calculation (min) (ACUTE ONLY): 25 min  Problem List:  Patient Active Problem List   Diagnosis Date Noted   [name] dementia (HCC) 03/22/2024   Hypothyroidism 03/22/2024   GERD without esophagitis 03/22/2024   BPH (benign prostatic hyperplasia) 03/22/2024   Essential hypertension 03/22/2024   Recurrent syncope 03/21/2024   Past Medical History:  Past Medical History:  Diagnosis Date   GERD (gastroesophageal reflux disease)    Hyperlipidemia    Wears hearing aid in both ears    Past Surgical History:  Past Surgical History:  Procedure Laterality Date   CATARACT EXTRACTION W/PHACO Left 05/22/2018   Procedure: CATARACT EXTRACTION PHACO AND INTRAOCULAR LENS PLACEMENT (IOC)  COMPLICATED LEFT TORIC LENS;  Surgeon: Mittie Gaskin, MD;  Location: [facility];  Service: Ophthalmology;  Laterality: Left;  MALYUGIN   CATARACT EXTRACTION W/PHACO Right 06/12/2018   Procedure: CATARACT EXTRACTION PHACO AND INTRAOCULAR LENS PLACEMENT (IOC)  RIGHT TORIC LENS;  Surgeon: Mittie Gaskin, MD;  Location: [facility];  Service: Ophthalmology;  Laterality: Right;   TONSILLECTOMY     HPI:  Per H&P, Willie Mcdonald is a 89 y.o. Caucasian male with medical history significant for dyslipidemia and GERD, as well as [name] dementia, who presented to the emergency room with acute onset of recurrent syncope.  The patient had another syncopal episode about a week ago.  Tonight he was sitting on his toilet and having a bowel movement before he lost consciousness for a few minutes per his wife. CT Head: No acute intracranial CT findings.  2. Moderate cerebral atrophy with atrophic ventriculomegaly. Mild small vessel  changes. Stable exam.   Assessment / Plan / Recommendation Clinical Impression  Pt seen for cognitive  linguistic assessment in the setting of recurrent syncope with baseline moderate to advanced dementia (per MD office visit in Sep 2025). No acute injury noted on Head CT and no head strike leading up to admission. Per chart review, pt seen by OP SLP in 2023 for education and cognitive training. Today, pt alert, oriented to self, able to follow simple direct commands in context. Speech with significantly reduced vocal intensity and articulation precision, significantly hindering intelligibility. Spouse present for assessment, reporting reduced engagement and limited intelligibility at baseline. Communication is further hindered by hearing loss, with hearing aids not present in room. Education shared with spouse regarding impact of unfamiliar environment on overall cognitive presentation specifically for pt with baseline cognitive impairments. Strategies shared to aid overall cognitive function, including familiar presence in the room, monitoring sleep patterns, verbal redirections, and limiting distractions/frustrations in the room. Spouse reported understanding. No further acute SLP services indicated.   Recommend:  - clear, concise instructions (with visuals as able) - increased volume to compensate for hearing loss - provide extended time for response - gentle, frequent redirections - limit external distractions - lean to multimodal communication (pt's gestures) in the setting of communication breakdown.   Of note- SLP advised nursing/spouse to monitor pt's safety with PO intake to determine need for bedside swallow evaluation.     SLP Assessment  SLP Recommendation/Assessment: All further Speech Language Pathology needs can be addressed in the next venue of care (if indicated) SLP Visit Diagnosis: Cognitive communication deficit (R41.841) (in the setting of moderate to advanced dementia and impact of acute hospitalization)     Assistance Recommended at Discharge  Frequent or constant  Supervision/Assistance  Functional Status Assessment Patient has not had a recent decline in their functional status        SLP Evaluation Cognition  Overall Cognitive Status: History of cognitive impairments - at baseline (MD visit in Sep 2025 indicating moderate to advanced dementia) Arousal/Alertness: Awake/alert Orientation Level: Oriented to person;Disoriented to place;Disoriented to time;Disoriented to situation Attention: Focused Focused Attention: Impaired Focused Attention Impairment: Verbal basic;Functional basic Memory:  (impaired at baseline) Awareness:  (impaired at baseline) Problem Solving:  (impaired at baseline) Safety/Judgment:  (impaired at baseline)       Comprehension  Auditory Comprehension Overall Auditory Comprehension: Other (comment) (untact for simple, direct commands in context) Interfering Components: Hearing (hearing aids not in the room) Visual Recognition/Discrimination Discrimination: Not tested Reading Comprehension Reading Status: Not tested    Expression Expression Primary Mode of Expression: Verbal Verbal Expression Overall Verbal Expression: Other (comment) (limited volitional utterances, impactecd by motor speech) Written Expression Written Expression: Not tested   Oral / Motor  Oral Motor/Sensory Function Overall Oral Motor/Sensory Function: Within functional limits Motor Speech Overall Motor Speech: Impaired at baseline Articulation: Impaired Level of Impairment: Word Intelligibility: Intelligibility reduced Word: 25-49% accurate Phrase: 25-49% accurate Sentence: 0-24% accurate Conversation: 0-24% accurate Interfering Components: Premorbid status;Hearing loss Effective Techniques: Increased vocal intensity           Camiah Humm Clapp, MS, CCC-SLP Speech Language Pathologist Rehab Services; [facility] [phone] (ascom)   Babygirl Trager J Clapp 03/22/2024, 11:24 AM

## 2024-03-22 NOTE — Assessment & Plan Note (Signed)
-  The patient will be admitted to an observation medically monitored bed. - Will check orthostatics q 12 hours. - Will obtain a bilateral carotid Doppler and 2D echo. - The patient will be gently hydrated with IV normal saline and monitored for arrhythmias. -Differential diagnoses would include neurally mediated syncope, cardiogenic, arrhythmias related,  orthostatic hypotension and less likely hypoglycemia.

## 2024-03-22 NOTE — Progress Notes (Signed)
 Patient lethargic and unable to keep eyes open to tolerate any Po medication at this time.

## 2024-03-22 NOTE — Progress Notes (Signed)
°  Echocardiogram 2D Echocardiogram has been performed.  Thedora GORMAN Louder 03/22/2024, 10:38 AM

## 2024-03-22 NOTE — Plan of Care (Signed)
°  Problem: Education: Goal: Knowledge of General Education information will improve Description: Including pain rating scale, medication(s)/side effects and non-pharmacologic comfort measures Outcome: Not Progressing   Problem: Health Behavior/Discharge Planning: Goal: Ability to manage health-related needs will improve Outcome: Not Progressing   Problem: Clinical Measurements: Goal: Ability to maintain clinical measurements within normal limits will improve Outcome: Not Progressing Goal: Will remain free from infection Outcome: Not Progressing Goal: Diagnostic test results will improve Outcome: Not Progressing Goal: Respiratory complications will improve Outcome: Not Progressing Goal: Cardiovascular complication will be avoided Outcome: Not Progressing   Problem: Activity: Goal: Risk for activity intolerance will decrease Outcome: Not Progressing   Problem: Nutrition: Goal: Adequate nutrition will be maintained Outcome: Not Progressing   Problem: Coping: Goal: Level of anxiety will decrease Outcome: Not Progressing   Problem: Elimination: Goal: Will not experience complications related to bowel motility Outcome: Not Progressing Goal: Will not experience complications related to urinary retention Outcome: Not Progressing   Problem: Pain Managment: Goal: General experience of comfort will improve and/or be controlled Outcome: Not Progressing   Problem: Safety: Goal: Ability to remain free from injury will improve Outcome: Not Progressing   Problem: Skin Integrity: Goal: Risk for impaired skin integrity will decrease Outcome: Not Progressing  Patient admitted 12/12 at 2300.

## 2024-03-22 NOTE — Discharge Instructions (Addendum)
 Your episode of passing out or syncope is likely due to you straining with using the bathroom.  Please continue MiraLAX  daily to prevent straining.  A referral for palliative care was sent.    For your left clavicle fracture, please continue to wear the sling provided for you as much as possible. It is okay to remove this to bathe. Please try to avoid lifting or pressing with this arm.  I will send a referral to orthopedic surgery and please follow up with your primary care doctor regarding this as well.

## 2024-03-22 NOTE — Assessment & Plan Note (Signed)
-   Will continue PPI therapy.

## 2024-03-22 NOTE — Assessment & Plan Note (Signed)
Will continue Flomax.

## 2024-03-22 NOTE — Progress Notes (Signed)
 OT Cancellation Note  Patient Details Name: Willie Mcdonald MRN: [id number] DOB: 21-Jul-1934   Cancelled Treatment:      Order received for OT evaluation, wife and son present in the room.  They report patient has caregivers at home who assist with all of the patient's care and they feel he is at his current baseline and do not need an OT evaluation.  They do report continued decline with his dementia over the last 6 months.  They want to continue to care for him at home per the patient's wishes.  Pt does require caregiver assistance to transfer and get out of bed.  Family would like to pursue a hospital bed at home for pt safety and to assist with transfers.  He may also require a lift at home if he continues to decline and to reduce caregiver burden. Pt has no skilled OT needs at this time.  Please re consult if needs arise.    Kaelin Holford T Crisol Muecke, OTR/L, CLT  Kathyleen Radice 03/22/2024, 3:21 PM

## 2024-03-22 NOTE — Plan of Care (Signed)
   Problem: Education: Goal: Knowledge of General Education information will improve Description: Including pain rating scale, medication(s)/side effects and non-pharmacologic comfort measures Outcome: Progressing   Problem: Pain Managment: Goal: General experience of comfort will improve and/or be controlled Outcome: Progressing   Problem: Safety: Goal: Ability to remain free from injury will improve Outcome: Progressing

## 2024-03-22 NOTE — Assessment & Plan Note (Addendum)
-   Continue Aricept  and Namenda . - Will continue Sinemet  IR for likely associated parkinsonism.

## 2024-03-22 NOTE — Assessment & Plan Note (Signed)
-   Will continue antihypertensive therapy.

## 2024-03-24 NOTE — Discharge Summary (Addendum)
 Physician Discharge Summary   Patient: Willie Mcdonald MRN: [id number] DOB: [birth year]  Admit date:     03/21/2024  Discharge date: 03/22/2024  Discharge Physician: Alban Pepper   PCP: Auston Reyes BIRCH, MD   Recommendations at discharge:    Repeat bmp to ensure potassium stable. Decide if would like to continue hypertension medications.   Discharge Diagnoses: Principal Problem:   Recurrent syncope Active Problems:   Essential hypertension   [name] dementia (HCC)   Hypothyroidism   GERD without esophagitis   BPH (benign prostatic hyperplasia)  Resolved Problems:   * No resolved hospital problems. [facility] Course: Per H&P HPI   Willie Mcdonald is a 89 y.o. Caucasian male with medical history significant for dyslipidemia and GERD, as well as [name] dementia, who presented to the emergency room with acute onset of recurrent syncope.  The patient had another syncopal episode about a week ago.  Tonight he was sitting on his toilet and having a bowel movement before he lost consciousness for a few minutes per his wife.  He did not have any chest pain or palpitations.  No paresthesias or focal muscle weakness.  No tinnitus or vertigo.  No cough or wheezing or hemoptysis.  He had left shoulder pain after his last fall.   ED Course: When the patient came to the ER, vital signs were within normal.  Labs revealed total protein 6.3 with otherwise unremarkable CMP.  CBC showed leukocytosis 12.3 and hemoglobin 12.7 hematocrit 36.7.  High-sensitivity troponin I was 37 and later 31. EKG as reviewed by me : EKG showed normal sinus rhythm with with a rate of 79 with low voltage QRS. Imaging: Noncontrasted head CT scan showed moderate cerebral atrophy with atrophic ventriculomegaly and mild small vessel changes with no acute intracranial abnormalities.  C-spine without contrast revealed degenerative changes of the cervical spine with no acute osseous abnormality.   The patient was  given 1 L bolus of IV lactated ringer  and 2 mg of IV morphine  sulfate.  He will be admitted to an observation medical telemetry bed for further evaluation and management.  Assessment and Plan: * Recurrent syncope Patient had situational syncopal episode. He was on the toilet attempting to pass stool and lost consciousness. He ultimately woke up. Patient has severe LB dementia and is unable to relay what happened. His TTE had poor acoustic windows and was unable to be read. Carotid dopplers obtained and without evidence to suggest bilateral CAS as etiology of his syncopal episodes. He had no arrhythmias. He was given IV hydration. He was more awake and alert and discharged home. Patient was given bowel regimen on discharge.     Recent UTI Patient completed course of antibiotics.   Leukocytosis  Improving from a week ago. He was treated for UTI at that time.   Nonischemic myocardial injury  Chronic. Unclear etiology. EKG w/o findings to suggest infarct.  TTE technically difficult unable to be read.  No further ischemic evaluation.   Essential hypertension Stopped ARB on discharge.   [name] dementia (HCC) Continued Aricept  and Namenda . And sinemet . Consider stopping some of these medications outpatient as could be contributing to episodes. Advanced. Patient's family would like to discuss with palliative care [facility] moving forward. A referral was sent to palliative care.   Hypothyroidism continued Synthroid .  GERD without esophagitis continued PPI therapy.  BPH (benign prostatic hyperplasia) Continued flomax  and finasteride .        Consultants: Palliative care  Procedures performed:  None  Disposition: Home Diet recommendation:  Discharge Diet Orders (From admission, onward)     Start     Ordered   03/22/24 0000  Diet general        03/22/24 1352           Regular diet DISCHARGE MEDICATION: Allergies as of 03/22/2024   No Known Allergies      Medication List      STOP taking these medications    cefUROXime  250 MG tablet Commonly known as: CEFTIN    telmisartan 80 MG tablet Commonly known as: MICARDIS   tiZANidine 4 MG capsule Commonly known as: ZANAFLEX       TAKE these medications    carbidopa -levodopa  25-100 MG tablet Commonly known as: SINEMET  IR Take 1 tablet by mouth at bedtime. The timing of this medication is very important.   aspirin  EC 81 MG tablet Take 81 mg by mouth daily.   cyanocobalamin  1000 MCG tablet Take 1,000 mcg by mouth daily.   donepezil  10 MG tablet Commonly known as: ARICEPT  Take 10 mg by mouth at bedtime.   DULoxetine  20 MG capsule Commonly known as: CYMBALTA  Take 20 mg by mouth 2 (two) times daily.   finasteride  5 MG tablet Commonly known as: PROSCAR  Take 5 mg by mouth daily.   glycopyrrolate 1 MG tablet Commonly known as: ROBINUL Take 1 mg by mouth 3 (three) times daily.   levothyroxine  25 MCG tablet Commonly known as: SYNTHROID  Take 25 mcg by mouth every morning.   lidocaine  5 % Commonly known as: Lidoderm  Place 1 patch onto the skin daily. Remove & Discard patch within 12 hours or as directed by MD   memantine  5 MG tablet Commonly known as: NAMENDA  Take 5 mg by mouth 2 (two) times daily.   metaxalone  800 MG tablet Commonly known as: SKELAXIN  Take 800 mg by mouth 2 (two) times daily.   modafinil  100 MG tablet Commonly known as: PROVIGIL  Take 100 mg by mouth.   omeprazole 40 MG capsule Commonly known as: PRILOSEC Take 40 mg by mouth daily.   polyethylene glycol 17 g packet Commonly known as: MIRALAX  / GLYCOLAX  Take 17 g by mouth daily.   pravastatin 20 MG tablet Commonly known as: PRAVACHOL Take 20 mg by mouth daily.   sildenafil 20 MG tablet Commonly known as: REVATIO Take 20 mg by mouth 3 (three) times daily.   tamsulosin  0.4 MG Caps capsule Commonly known as: FLOMAX  Take 0.4 mg by mouth.   Vitamin D3 10 MCG (400 UNIT) tablet Take 800 Units by mouth. What  changed: Another medication with the same name was removed. Continue taking this medication, and follow the directions you see here.        Follow-up Information     Auston Reyes BIRCH, MD. Schedule an appointment as soon as possible for a visit in 1 week(s).   Specialty: Internal Medicine Contact information: [address] [place] [address] [phone]                Discharge Exam: Fredricka Weights   03/21/24 1911  Weight: 78.2 kg   Physical Exam  Constitutional: In no distress.  Cardiovascular: Normal rate, regular rhythm. No lower extremity edema  Pulmonary: Non labored breathing on room air, no wheezing or rales.   Abdominal: Soft. Non distended and non tender Musculoskeletal: Normal range of motion.     Neurological: Alert and oriented to person  Skin: Skin is warm and dry.  Condition at discharge: stable  The results of significant diagnostics from this hospitalization (including imaging, microbiology, ancillary and laboratory) are listed below for reference.   Imaging Studies: ECHOCARDIOGRAM COMPLETE Result Date: 03/22/2024    ECHOCARDIOGRAM REPORT   Patient Name:   DONALD MEMOLI Date of Exam: 03/22/2024 Medical Rec #:  [id number]        Height:       68.0 in Accession #:    [id number]       Weight:       172.4 lb Date of Birth:  12-22-1934         BSA:          1.919 m Patient Age:    89 years         BP:           128/67 mmHg Patient Gender: M                HR:           77 bpm. Exam Location:  [facility] Procedure: 2D Echo Indications:     Syncope R55  History:         Patient has no prior history of Echocardiogram examinations.  Sonographer:     Thedora Louder RDCS, FASE Referring Phys:  [id number] [facility] Diagnosing Phys: Marsa Dooms MD  Sonographer Comments: Technically challenging study due to limited acoustic windows, no parasternal window, no apical window and no subcostal window. Image acquisition challenging due to  patient body habitus and Image acquisition challenging due to respiratory motion. IMPRESSIONS FINDINGS  No acoustic windows, unable to assess LV function. If clinically indicated, suggest TEE. Left ventricular diastolic parameters are indeterminate.  Additional Comments: 3D was performed not requiring image post processing on an independent workstation and was indeterminate. Marsa Dooms MD Electronically signed by Marsa Dooms MD Signature Date/Time: 03/22/2024/1:23:01 PM    Final    US  Carotid Bilateral Result Date: 03/22/2024 CLINICAL DATA:  89 year old male with history of syncope. EXAM: BILATERAL CAROTID DUPLEX ULTRASOUND TECHNIQUE: Elnor scale imaging, color Doppler and duplex ultrasound were performed of bilateral carotid and vertebral arteries in the neck. COMPARISON:  None Available. FINDINGS: Criteria: Quantification of carotid stenosis is based on velocity parameters that correlate the residual internal carotid diameter with NASCET-based stenosis levels, using the diameter of the distal internal carotid lumen as the denominator for stenosis measurement. The following velocity measurements were obtained: RIGHT ICA: Peak systolic velocity 150 cm/sec, End diastolic velocity 18 cm/sec CCA: Peak systolic velocity 104 cm/sec SYSTOLIC ICA/CCA RATIO:  1.4 ECA: Peak systolic velocity 162 cm/sec LEFT ICA: Peak systolic velocity 240 cm/sec, End diastolic velocity 42 cm/sec CCA: 82 cm/sec SYSTOLIC ICA/CCA RATIO:  2.9 ECA: 128 cm/sec RIGHT CAROTID ARTERY: Moderate multifocal atherosclerotic plaque formation extending from the carotid bulb the bifurcation. No significant tortuosity. Normal low resistance waveforms. RIGHT VERTEBRAL ARTERY:  Antegrade flow. LEFT CAROTID ARTERY: Multifocal atherosclerotic plaque formation extending from the carotid bulb bifurcation, most severe about the proximal internal carotid artery. No significant tortuosity. Normal low resistance waveforms. LEFT VERTEBRAL ARTERY:   Antegrade flow. Upper extremity non-invasive blood pressures: Right: Not obtained mm/Hg Left: Not obtained mm/Hg IMPRESSION: 1. Right carotid artery system: Less than 50% stenosis secondary to moderate multifocal atherosclerotic plaque formation. 2. Left carotid artery system: Greater than 69% but less than near occlusion secondary to severe atherosclerotic plaque formation, most prominent at the proximal internal carotid. 3.  Vertebral artery system: Patent with antegrade flow bilaterally. Ester Sides, MD  [facility] Radiology Electronically Signed   By: Ester Sides M.D.   On: 03/22/2024 10:40   CT Cervical Spine Wo Contrast Result Date: 03/21/2024 CLINICAL DATA:  Neck trauma syncope EXAM: CT CERVICAL SPINE WITHOUT CONTRAST TECHNIQUE: Multidetector CT imaging of the cervical spine was performed without intravenous contrast. Multiplanar CT image reconstructions were also generated. RADIATION DOSE REDUCTION: This exam was performed according to the departmental dose-optimization program which includes automated exposure control, adjustment of the mA and/or kV according to patient size and/or use of iterative reconstruction technique. COMPARISON:  None Available. FINDINGS: Alignment: No subluxation.  Facet alignment is normal Skull base and vertebrae: No acute fracture. No primary bone lesion or focal pathologic process. Soft tissues and spinal canal: No prevertebral fluid or swelling. No visible canal hematoma. Disc levels: Multilevel degenerative changes. Moderate severe diffuse disc space narrowing C4 through T1. Multilevel facet degenerative change with foraminal narrowing. No high-grade bony canal stenosis Upper chest: Negative. Other: None IMPRESSION: Degenerative changes of the cervical spine. No acute osseous abnormality. Electronically Signed   By: Luke Bun M.D.   On: 03/21/2024 20:49   DG Shoulder Left Result Date: 03/21/2024 CLINICAL DATA:   Fall with shoulder pain EXAM: LEFT SHOULDER - 2+ VIEW COMPARISON:  None Available. FINDINGS: Acute mildly displaced fracture involving the distal end of left clavicle. AC joint does not appear widened. Mild AC joint and glenohumeral degenerative change. Left apex is clear IMPRESSION: Acute mildly displaced distal left clavicle fracture. Electronically Signed   By: Luke Bun M.D.   On: 03/21/2024 20:45   CT Head Wo Contrast Result Date: 03/21/2024 EXAM: CT HEAD WITHOUT 03/21/2024 07:59:56 PM TECHNIQUE: CT of the head was performed without the administration of intravenous contrast. Automated exposure control, iterative reconstruction, and/or weight based adjustment of the mA/kV was utilized to reduce the radiation dose to as low as reasonably achievable. COMPARISON: Head CT 05/19/2022. CLINICAL HISTORY: Mental status change, unknown cause. History of the patient having a syncopal episode on the toilet, followed by vomiting, with history of advanced dementia. FINDINGS: BRAIN AND VENTRICLES: No acute intracranial hemorrhage. No mass effect or midline shift. No extra-axial fluid collection. No evidence of acute infarct. There is moderate cerebral atrophy with atrophic ventriculomegaly and relatively mild small vessel disease in the cerebral white matter. There is slight cerebellar volume loss. There is calcification in the siphons and distal vertebral arteries. No hyperdense vessel identified. The basal cisterns are clear. ORBITS: No acute orbital findings. There are old lens replacements. 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 SOFT TISSUES AND SKULL: No acute skull fracture. No acute soft tissue abnormality. IMPRESSION: 1. No acute intracranial CT findings. 2. Moderate cerebral atrophy with atrophic ventriculomegaly. Mild small vessel changes. Stable exam. Electronically signed by: Francis Quam MD 03/21/2024 08:09 PM EST  RP Workstation: [id number]    Microbiology: Results for orders placed or performed during the hospital encounter of 03/15/24  Urine Culture     Status: Abnormal   Collection Time: 03/15/24  4:50 PM   Specimen: Urine, Random  Result Value Ref Range Status   Specimen Description   Final    URINE, RANDOM Performed at [facility], [address] [place], [place] [address]    Special Requests   Final    NONE Reflexed from [id number] Performed at [facility], [address], [place], [place] [address]    Culture 70,000 COLONIES/mL  PROTEUS MIRABILIS (A)  Final   Report Status 03/17/2024 FINAL  Final   Organism ID, Bacteria PROTEUS MIRABILIS (A)  Final      Susceptibility   Proteus mirabilis - MIC*    AMPICILLIN <=2 SENSITIVE Sensitive     CEFAZOLIN (URINE) Value in next row Sensitive      2 SENSITIVEThis is a modified FDA-approved test that has been validated and its performance characteristics determined by the reporting laboratory.  This laboratory is certified under the Clinical Laboratory Improvement Amendments CLIA as qualified to perform high complexity clinical laboratory testing.    CEFEPIME Value in next row Sensitive      2 SENSITIVEThis is a modified FDA-approved test that has been validated and its performance characteristics determined by the reporting laboratory.  This laboratory is certified under the Clinical Laboratory Improvement Amendments CLIA as qualified to perform high complexity clinical laboratory testing.    ERTAPENEM Value in next row Sensitive      2 SENSITIVEThis is a modified FDA-approved test that has been validated and its performance characteristics determined by the reporting laboratory.  This laboratory is certified under the Clinical Laboratory Improvement Amendments CLIA as qualified to perform high complexity clinical laboratory testing.    CEFTRIAXONE  Value in next row Sensitive      2 SENSITIVEThis is a modified FDA-approved test that  has been validated and its performance characteristics determined by the reporting laboratory.  This laboratory is certified under the Clinical Laboratory Improvement Amendments CLIA as qualified to perform high complexity clinical laboratory testing.    CIPROFLOXACIN Value in next row Sensitive      2 SENSITIVEThis is a modified FDA-approved test that has been validated and its performance characteristics determined by the reporting laboratory.  This laboratory is certified under the Clinical Laboratory Improvement Amendments CLIA as qualified to perform high complexity clinical laboratory testing.    GENTAMICIN Value in next row Sensitive      2 SENSITIVEThis is a modified FDA-approved test that has been validated and its performance characteristics determined by the reporting laboratory.  This laboratory is certified under the Clinical Laboratory Improvement Amendments CLIA as qualified to perform high complexity clinical laboratory testing.    NITROFURANTOIN Value in next row Resistant      2 SENSITIVEThis is a modified FDA-approved test that has been validated and its performance characteristics determined by the reporting laboratory.  This laboratory is certified under the Clinical Laboratory Improvement Amendments CLIA as qualified to perform high complexity clinical laboratory testing.    TRIMETH/SULFA Value in next row Sensitive      2 SENSITIVEThis is a modified FDA-approved test that has been validated and its performance characteristics determined by the reporting laboratory.  This laboratory is certified under the Clinical Laboratory Improvement Amendments CLIA as qualified to perform high complexity clinical laboratory testing.    AMPICILLIN/SULBACTAM Value in next row Sensitive      2 SENSITIVEThis is a modified FDA-approved test that has been validated and its performance characteristics determined by the reporting laboratory.  This laboratory is certified under the Clinical Laboratory  Improvement Amendments CLIA as qualified to perform high complexity clinical laboratory testing.    PIP/TAZO Value in next row Sensitive      <=4 SENSITIVEThis is a modified FDA-approved test that has been validated and its performance characteristics determined by the reporting laboratory.  This laboratory is certified under the Clinical Laboratory Improvement Amendments CLIA as qualified to perform high complexity clinical laboratory testing.    MEROPENEM  Value in next row Sensitive      <=4 SENSITIVEThis is a modified FDA-approved test that has been validated and its performance characteristics determined by the reporting laboratory.  This laboratory is certified under the Clinical Laboratory Improvement Amendments CLIA as qualified to perform high complexity clinical laboratory testing.    * 70,000 COLONIES/mL PROTEUS MIRABILIS    Labs: CBC: Recent Labs  Lab 03/21/24 1915 03/22/24 0534  WBC 12.3* 10.4  NEUTROABS 9.2*  --   HGB 12.7* 11.6*  HCT 36.7* 34.0*  MCV 93.1 92.4  PLT 325 274   Basic Metabolic Panel: Recent Labs  Lab 03/21/24 1915 03/22/24 0534  NA 139 139  K 3.6 3.4*  CL 102 107  CO2 25 23  GLUCOSE 120* 101*  BUN 16 15  CREATININE 0.96 0.76  CALCIUM 9.1 9.0   Liver Function Tests: Recent Labs  Lab 03/21/24 1915  AST 17  ALT 6  ALKPHOS 98  BILITOT 0.4  PROT 6.3*  ALBUMIN 3.5   CBG: No results for input(s): GLUCAP in the last 168 hours.  Discharge time spent: greater than 30 minutes.  Signed: Alban Pepper, MD [facility] 03/24/2024

## 2024-03-25 NOTE — Progress Notes (Signed)
 Willie Mcdonald is a 89 y.o. here for Medicare Wellness Visit  MEDICARE WELLNESS VISIT  Providers Rendering Care 1. Dr. Reyes Costa (PCP) Pt in NAD. BP stable on current meds. Has prediabetes not on meds, HLD not on statin due to myopathy and GERD on PPI. Also with progressive dementia on meds followed by Neurology.  Functional Assessment (1) Hearing: Demonstrates no difficulty in hearing during normal conversation (2) Risk of Falls: Patient has in the last year, Gait not tested (3) Home Safety: Patient feels secure in their home, There are operational smoke alarms in multiple areas of the home (4) Activities of Daily Living: Has help with personal grooming and household chores, including cooking, cleaning and laundry. Family Manages Personal finances.  Depression Screening PHQ 2/9 last 3 flowsheet values     06/06/2021    8:51 AM 12/07/2021    8:57 AM  PHQ-2/9 Depression Screening   (OBSOLETE) Little interest or pleasure in doing things 0 0  (OBSOLETE) Feeling down, depressed, or hopeless (or irritable for Teens only)? 0 0  (OBSOLETE) Total Prescreening Score 0 0  (OBSOLETE) Total Score = 0 0     Depression Severity and Treatment Recommendations:  0-4= None  5-9= Mild / Treatment: Support, educate to call if worse; return in one month  10-14= Moderate / Treatment: Support, watchful waiting; Antidepressant or Psychotherapy  15-19= Moderately severe / Treatment: Antidepressant OR Psychotherapy  >= 20 = Major depression, severe / Antidepressant AND Psychotherapy   Cognitive Impairment Patient has episodes of loosing things, being forgetful. Seems oriented to person, place and time.  Responses appear appropriate and timely to this observer.  PREVENTION PLAN  Cardiovascular: FLP assessed LDL=118 Diabetes: A1c or FBG assessed A1c=5.9 Glaucoma: N/A Hepatitis B (HBV) Vaccine:  Not Applicable Smoking Cessation:  Not Applicable  Other Personalized Health  Advice  Encouraged patient to exercise 5 days a week, walking, water aerobics, gentle stretching recommended. Increase dietary intake of fresh fruits and vegetables, reduce red meat to twice a week.  End of Life Counseling Patient has living will in place; POA - ; Full Code  Current Outpatient Medications  Medication Sig Dispense Refill   carbidopa -levodopa  (SINEMET ) 25-100 mg tablet Take 1 tablet by mouth at bedtime 30 tablet 11   cholecalciferol  (VITAMIN D3) 400 unit tablet Take 800 Units by mouth once daily     cyanocobalamin  (VITAMIN B12) 1000 MCG tablet Take 1,000 mcg by mouth once daily     DULoxetine  (CYMBALTA ) 20 MG DR capsule Take 20 mg by mouth 2 (two) times daily     glycopyrrolate (ROBINUL) 1 mg tablet Take 1 tablet (1 mg total) by mouth 3 (three) times daily 90 tablet 11   levothyroxine  (SYNTHROID ) 25 MCG tablet Take 1 tablet (25 mcg total) by mouth once daily Take on an empty stomach with a glass of water at least 30-60 minutes before breakfast. 30 tablet 11   memantine  (NAMENDA ) 5 MG tablet Take 1 tablet (5 mg total) by mouth 2 (two) times daily 60 tablet 3   metaxalone  (SKELAXIN ) 800 mg tablet Take 1 tablet by mouth twice daily 60 tablet 0   modafiniL  (PROVIGIL ) 100 MG tablet Take 1 tablet (100 mg total) by mouth once daily 30 tablet 5   omeprazole (PRILOSEC) 40 MG DR capsule TAKE 1 CAPSULE BY MOUTH EVERY DAY 90 capsule 3   No current facility-administered medications for this visit.    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 - Approx    Health Maintenance  Topic Date Due   RSV Immunization Pregnant or 50+ (1 - 1-dose 75+ series) Never done   Shingrix (1 of 2) Never done   Adult Tetanus (Td And Tdap)  11/20/2021   Depression Screening  12/08/2022   COVID-19 Vaccine (2 - 2025-26 season) 12/10/2023   Influenza Vaccine (1) 12/10/2023   Lipid Panel  02/19/2024   Creatinine Level  10/17/2024   Potassium Level  10/17/2024   Serum Calcium  10/17/2024   Medicare Subsequent AWV [id number]  03/26/2025   Medicare Initial AWV [id number]  Completed   Pneumococcal Vaccine: 50+  Completed   Hib Vaccines  Aged Out   Hepatitis A Vaccines  Aged Out   Meningococcal B Vaccine  Aged Out   Meningococcal ACWY Vaccine  Aged Out   HPV Vaccines  Aged Out    Vitals:   03/25/24 1109  BP: 118/66  Pulse: 96  SpO2: 97%  Height: 165.1 cm (5' 5)   Body mass index is 24.96 kg/m. General: Alert oriented x3  Eyes: Sclera and conjunctiva clear; pupils equal round and reactive to light and accommodation; extraocular movements intact Nose: Mucosa healthy without drainage or ulceration Oropharynx: No suspicious lesions Neck: No swelling, masses, stiffness, pain, limited movement, carotid pulses normal bilaterally, thyroid normal size, no masses palpated. No bruits heard. Lungs: Respirations unlabored; clear to auscultation bilaterally Back: No spinal deformity Cardiovascular: Heart regular rate and rhythm without murmurs, gallops, or rubs Abdomen: Soft; non tender; non distended;  no masses or organomegaly Lymph Nodes: No significant cervical, supraclavicular, or axillary lymphadenopathy  noted Musculoskeletal: No active joint inflammation Extremities: Normal, no edema Pulses: Dorsalis pedis palpable and symmetric bilaterally Neurologic: Alert and oriented; speech intact; face symmetrical; moves all extremities well    Assessment/Plan  1. Medicare wellness visit- Medications and allergies reviewed. Copy of preventative health provided.  Labs reviewed. 2. Dementia- PC consult ordered 3. HTN- stable, same meds 4. GERD- stable on PPI 5. Thyroid dz- same dose, labs today 6. RTC 3 mo, sooner if needed Reyes JONETTA Costa, MD, MD

## 2024-05-11 DEATH — deceased
